# Patient Record
Sex: Female | Born: 1960 | Race: White | Hispanic: No | Marital: Married | Smoking: Never smoker
Health system: Southern US, Academic
[De-identification: ages and names within clinical notes are randomized; demographics above are authoritative.]

## PROBLEM LIST (undated history)

## (undated) DIAGNOSIS — I341 Nonrheumatic mitral (valve) prolapse: Secondary | ICD-10-CM

## (undated) DIAGNOSIS — G43909 Migraine, unspecified, not intractable, without status migrainosus: Secondary | ICD-10-CM

## (undated) DIAGNOSIS — E785 Hyperlipidemia, unspecified: Secondary | ICD-10-CM

## (undated) DIAGNOSIS — K56699 Other intestinal obstruction unspecified as to partial versus complete obstruction: Secondary | ICD-10-CM

## (undated) DIAGNOSIS — R011 Cardiac murmur, unspecified: Secondary | ICD-10-CM

## (undated) DIAGNOSIS — R0602 Shortness of breath: Secondary | ICD-10-CM

## (undated) DIAGNOSIS — J9601 Acute respiratory failure with hypoxia: Secondary | ICD-10-CM

## (undated) DIAGNOSIS — F3289 Other specified depressive episodes: Secondary | ICD-10-CM

## (undated) DIAGNOSIS — K219 Gastro-esophageal reflux disease without esophagitis: Secondary | ICD-10-CM

## (undated) DIAGNOSIS — I10 Essential (primary) hypertension: Secondary | ICD-10-CM

## (undated) HISTORY — DX: Shortness of breath: R06.02

## (undated) HISTORY — PX: SHOULDER SURGERY: SHX246

## (undated) HISTORY — PX: HX BREAST BIOPSY: SHX20

## (undated) HISTORY — DX: Acute respiratory failure with hypoxia: J96.01

## (undated) HISTORY — DX: Hyperlipidemia, unspecified: E78.5

## (undated) HISTORY — PX: HX TUBAL LIGATION: SHX77

## (undated) HISTORY — DX: Cardiac murmur, unspecified: R01.1

## (undated) HISTORY — DX: Gastro-esophageal reflux disease without esophagitis: K21.9

## (undated) HISTORY — PX: INCONTINENCE SURGERY: SHX676

## (undated) HISTORY — PX: BREAST MASS EXCISION: SHX1267

---

## 1993-10-11 ENCOUNTER — Other Ambulatory Visit (HOSPITAL_COMMUNITY): Payer: Self-pay

## 2006-03-31 ENCOUNTER — Emergency Department (HOSPITAL_COMMUNITY): Payer: Self-pay | Admitting: EXTERNAL

## 2012-04-15 ENCOUNTER — Ambulatory Visit: Payer: Self-pay | Attending: ORTHOPEDIC, SPORTS MEDICINE | Admitting: ORTHOPEDIC, SPORTS MEDICINE

## 2012-04-15 ENCOUNTER — Other Ambulatory Visit (INDEPENDENT_AMBULATORY_CARE_PROVIDER_SITE_OTHER): Payer: Self-pay | Admitting: ORTHOPEDIC, SPORTS MEDICINE

## 2012-04-15 ENCOUNTER — Encounter (INDEPENDENT_AMBULATORY_CARE_PROVIDER_SITE_OTHER): Payer: Self-pay | Admitting: ORTHOPEDIC, SPORTS MEDICINE

## 2012-04-15 ENCOUNTER — Ambulatory Visit (HOSPITAL_BASED_OUTPATIENT_CLINIC_OR_DEPARTMENT_OTHER): Payer: Self-pay

## 2012-04-15 VITALS — BP 133/84 | HR 66 | Temp 99.3°F | Ht 63.54 in | Wt 197.0 lb

## 2012-04-15 DIAGNOSIS — M7500 Adhesive capsulitis of unspecified shoulder: Secondary | ICD-10-CM | POA: Insufficient documentation

## 2012-04-15 NOTE — H&P (Addendum)
 [facility]                              DEPARTMENT OF [place], [place] [address]                                PATIENT NAME: Yesenia Walker, Yesenia Walker [facility] WLFAZM:[id number]  DATE OF SERVICE:04/15/2012  DATE OF BIRTH: 1961/09/19    HISTORY AND PHYSICAL    SUBJECTIVE:  Yesenia Walker is a 51 year old right-hand dominant female who presented to clinic today for evaluation of her left shoulder pain.  She apparently had a work-related injury on February 15, 2010, when she was lifting a mop bucket into a sink and felt a pop and pain in her shoulder.  She apparently had 3 MRIs before finding out she had a labral tear, so she subsequently underwent an arthroscopy with a labral debridement and debridement of partial-thickness rotator cuff tear as well as a subacromial decompression in November 2011.  Following that surgery, she was sent to physical therapy.  She also had some physical therapy beforehand but she continues to be symptomatic.  She reports it really did not help.  She has continued to complain of pain around the periscapular region and into the trapezius area.  She has had a couple trigger point injections which helped for a while.  She also notices some pain over into the chest.  She also has some pain around the anterior part of her shoulder.  She reports it radiates into the biceps.  She denies any numbness or tingling distally and no pain radiating down the arm.    PAST MEDICAL HISTORY:  Hypertension, bladder dysfunction.    PAST SURGICAL HISTORY:  Cholecystectomy, tubal ligation and shoulder as mentioned above.    CURRENT MEDICATIONS:  1.  Propranolol .  2.  Hydrochlorothiazide .    ALLERGIES:  Ultram.    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  Denies use of tobacco or alcohol.  She is married.    FAMILY HISTORY:  Her mother has history of diabetes and hypertension.  Grandmother and aunt history of  cancer.    REVIEW OF SYSTEMS:  She has noticed some night sweats due to menopause.  She has some urinary issues.  She has mitral valve prolapse and experiences chest pain from that.  She has left leg swelling.  She is being treated for a bladder disease, so she has been having problems with urgency.  She has problems with depression.  All other systems are essentially negative except for the HPI.    OBJECTIVE:  On physical exam today, Yesenia Walker is a pleasant female in no acute distress, appropriate mood and affect.  Blood pressure 133/84, pulse 66, temperature 99.3 degrees Fahrenheit.  She is 197 pounds and 161.4 cm.  Her sclerae are nonicteric.  Breathing nonlabored.  Abdomen:   Not distended.  On evaluation of left upper extremity, she had forward elevation passively to about 140, abduction to about 85, external rotation to 40 versus 80 on the contralateral side.  Internal rotation to L5 with pain associated with that.  She was tender to palpation on the  anterior aspect of her shoulder, no AC joint tenderness.  She was tender to palpation all around the periscapular region.  Her cuff strength was 4/5 in external rotation, 5/5 internal rotation.  She was neurovascularly intact distally.  No skin rashes over the upper extremity.    IMAGING:  Radiographs were reviewed in the PACS system of the left shoulder, which are negative.  We reviewed an MRI report from August 24, 2010, which stated there was an anterior-superior labral tear with no evidence of rotator cuff tear identified.  There was fluid in the subacromial bursa, very minimal hypertrophy of the [facility] joint noted to be present.  We also reviewed the operative note from Dr. Joesph dated November 02, 2010, which stated he did a debridement of the superior labral tear as well as a partial-thickness rotator cuff tear and subacromial decompression, but it was very minimal debridement of the labral tear.    ASSESSMENT:  Left shoulder postoperative adhesive  capsulitis.      PLAN:  We had a discussion with Yesenia Walker concerning treatment options for her shoulder.  At this point, we would offer her a left shoulder arthroscopy with lysis of adhesions, manipulation under anesthesia with the understanding this may not necessarily help with the pain that she has around the periscapular region and the trapezial region.  If she should decide to have surgery, we would offer her that.  She will need physical therapy 3-4 times a week postoperatively as well as pain medication for postoperative pain control.  We will not make a return appointment for now.      Greig Frost RIGGERS  [place] [facility]    Zachary Ceo, MD  Assistant Professor  [facility] Department of Orthopaedics    [id number]; D: 04/15/2012 12:04:43; T: 04/15/2012 12:34:28    cc: Yesenia Walker       [address]       [place], [place] [address]     I have seen and examined this patient with the Physician Assistant and concur with assessment and plan.    Zachary MARLA Ceo, MD

## 2014-01-30 DIAGNOSIS — Z8249 Family history of ischemic heart disease and other diseases of the circulatory system: Secondary | ICD-10-CM | POA: Insufficient documentation

## 2014-01-30 DIAGNOSIS — Z842 Family history of other diseases of the genitourinary system: Secondary | ICD-10-CM | POA: Insufficient documentation

## 2014-01-30 DIAGNOSIS — Z833 Family history of diabetes mellitus: Secondary | ICD-10-CM | POA: Insufficient documentation

## 2014-04-09 DIAGNOSIS — R4589 Other symptoms and signs involving emotional state: Secondary | ICD-10-CM | POA: Insufficient documentation

## 2014-04-09 DIAGNOSIS — F419 Anxiety disorder, unspecified: Secondary | ICD-10-CM | POA: Insufficient documentation

## 2014-04-09 DIAGNOSIS — M19019 Primary osteoarthritis, unspecified shoulder: Secondary | ICD-10-CM | POA: Insufficient documentation

## 2014-04-09 DIAGNOSIS — G8929 Other chronic pain: Secondary | ICD-10-CM | POA: Insufficient documentation

## 2014-04-09 DIAGNOSIS — Z5689 Other problems related to employment: Secondary | ICD-10-CM | POA: Insufficient documentation

## 2014-04-09 DIAGNOSIS — M67919 Unspecified disorder of synovium and tendon, unspecified shoulder: Secondary | ICD-10-CM | POA: Insufficient documentation

## 2014-11-12 DIAGNOSIS — Z6834 Body mass index (BMI) 34.0-34.9, adult: Secondary | ICD-10-CM | POA: Insufficient documentation

## 2017-11-14 DIAGNOSIS — N3010 Interstitial cystitis (chronic) without hematuria: Secondary | ICD-10-CM | POA: Insufficient documentation

## 2017-11-14 DIAGNOSIS — R109 Unspecified abdominal pain: Secondary | ICD-10-CM | POA: Insufficient documentation

## 2017-11-15 DIAGNOSIS — N200 Calculus of kidney: Secondary | ICD-10-CM | POA: Insufficient documentation

## 2017-11-15 DIAGNOSIS — N201 Calculus of ureter: Secondary | ICD-10-CM | POA: Insufficient documentation

## 2018-07-10 ENCOUNTER — Ambulatory Visit (HOSPITAL_COMMUNITY)
Admission: RE | Admit: 2018-07-10 | Discharge: 2018-07-10 | Disposition: A | Discharge status: Home / Self Care | Payer: Self-pay | Source: Ambulatory Visit

## 2019-10-24 ENCOUNTER — Ambulatory Visit (HOSPITAL_COMMUNITY)
Admission: RE | Admit: 2019-10-24 | Discharge: 2019-10-24 | Disposition: A | Discharge status: Home / Self Care | Payer: Self-pay | Source: Ambulatory Visit

## 2019-10-24 IMAGING — MG 3D SCREENING MAMMO BIL W/CAD
5 series · 7 of 24 positions shown · non-contrast
Comparison: 09/25/2018 and 09/24/2017.

------------- REPORT [id number] -------------
[facility]
[address]
Daina Ms.ANUGERAH, [place] NURUL:
We wish to report the following on your recent mammography examination. We are sending a report to your referring physician or other health care provider. 
(       Normal/Negative:
No evidence of cancer.
This statement is mandated by the [place], Department of Health.
Your examination was performed by one of our technologists, who are registered radiological technologists and also specially certified in mammography:
___
Parlak, Edaly (M)
Nepomuceno, Martinez (M)

Your mammogram was interpreted by our radiologist.
( 
Sofeine Made, M.D.
(Annual Breast Examination by a physician or other health care provider
(Annual Mammography Screening beginning at age 40
(Monthly Breast Self Examination
------------- REPORT [id number] -------------
EXAM:  3D BILATERAL ANNUAL SCREENING DIGITAL MAMMOGRAM WITH TOMOSYNTHESIS AND CAD
INDICATION: Screening.

[R CC · right · 0.10mm/px · 2 of 2 slices shown]
[im 1/2]
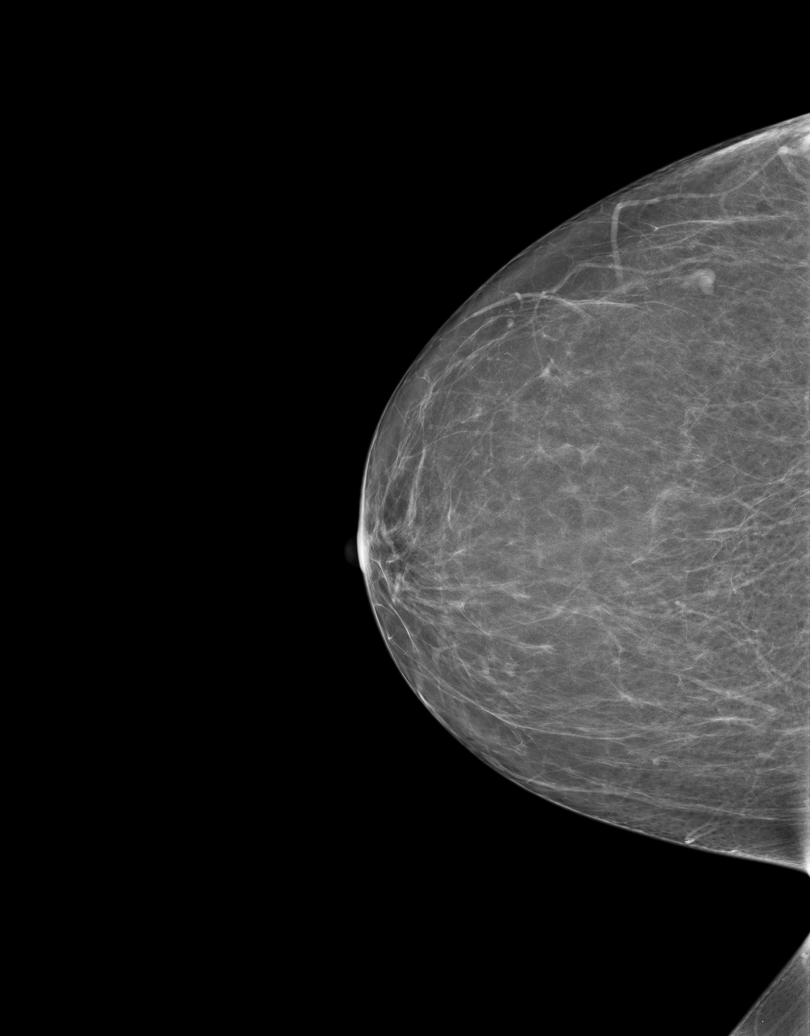
[im 2/2]
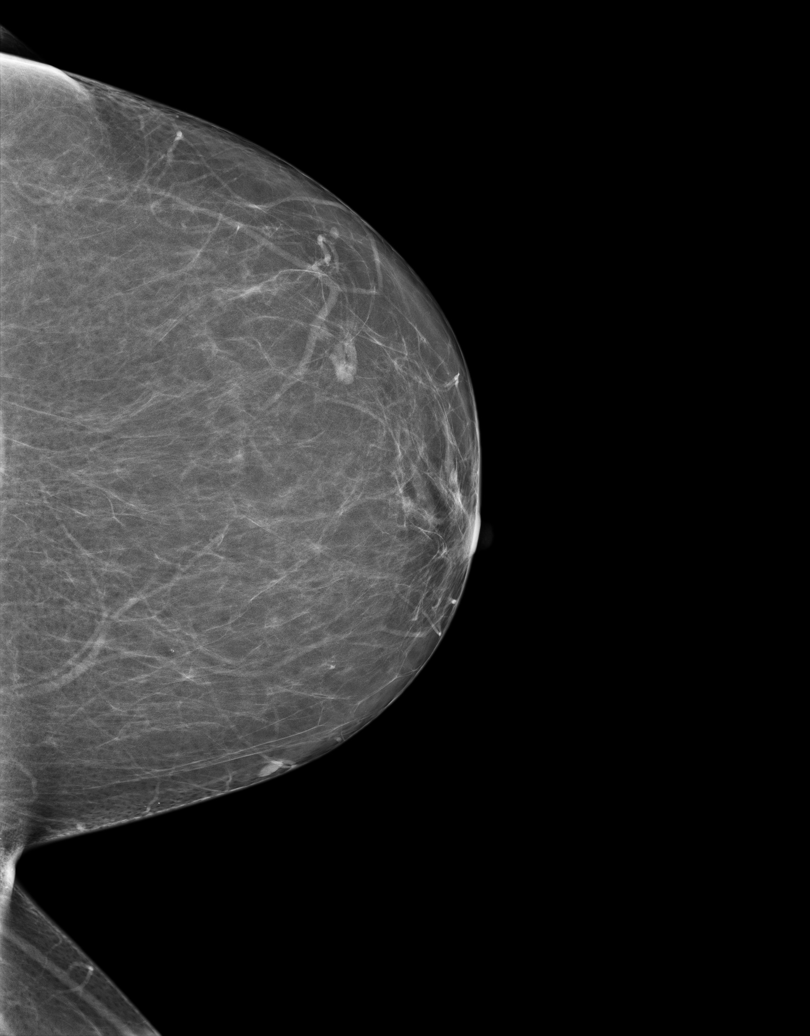

[3D SCREENING MAMMO BIL W/CAD · 2 acquisitions, 2 frames shown (1 of 2)]
[im 1/2]
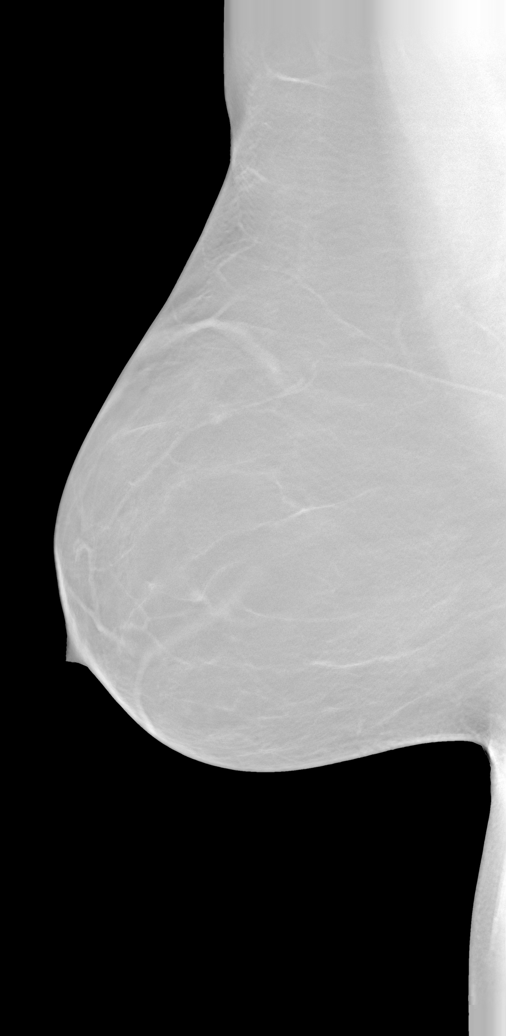
[im 2/2]
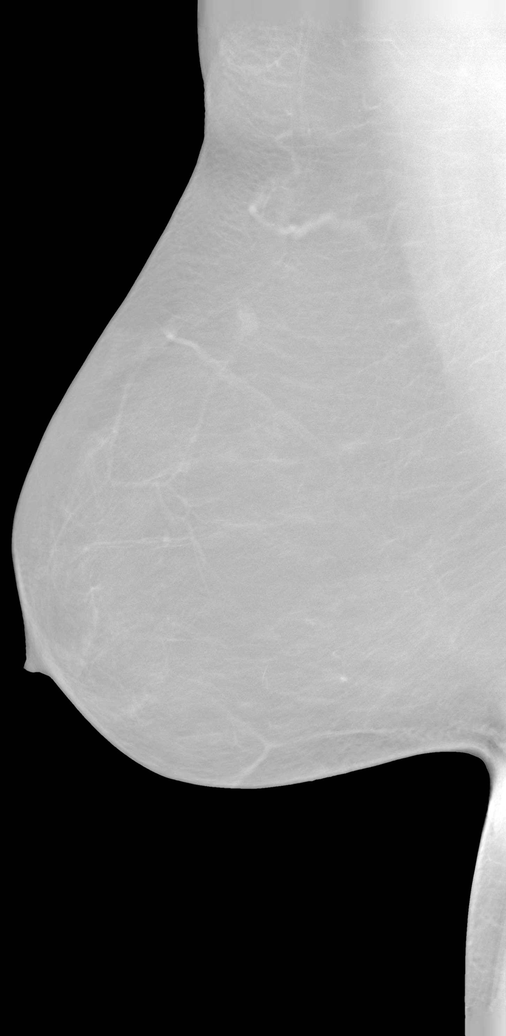

[R]
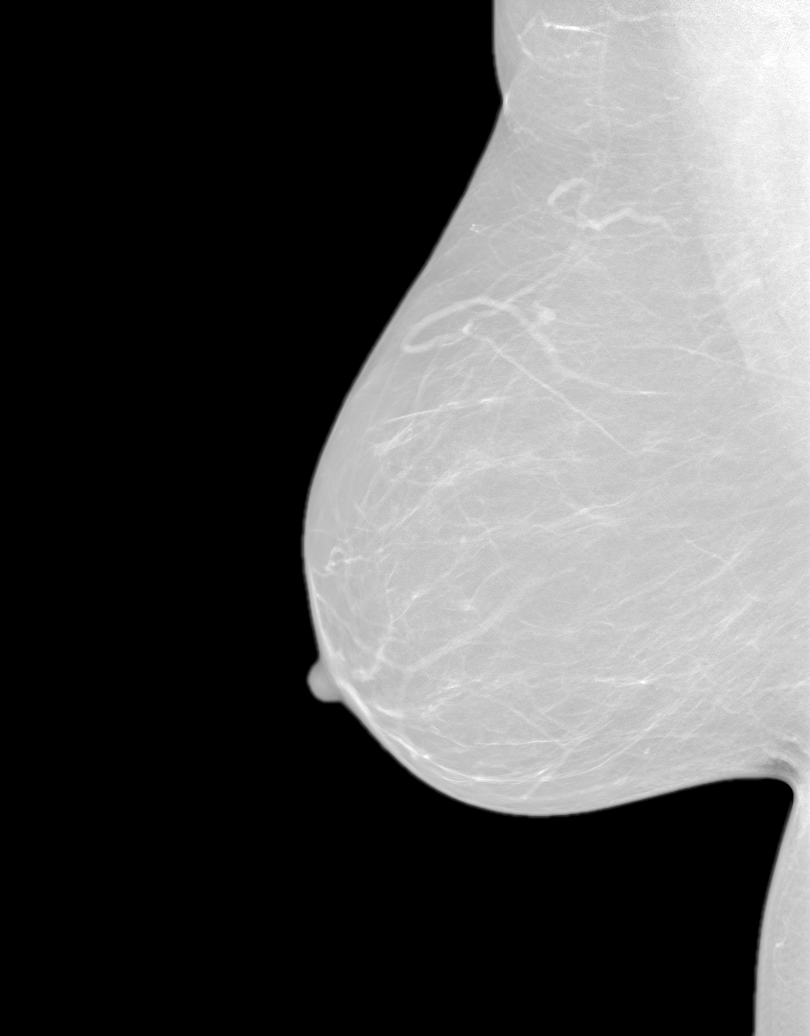

[3D SCREENING MAMMO BIL W/CAD (2 of 2) · tomo slice 14/90.0]
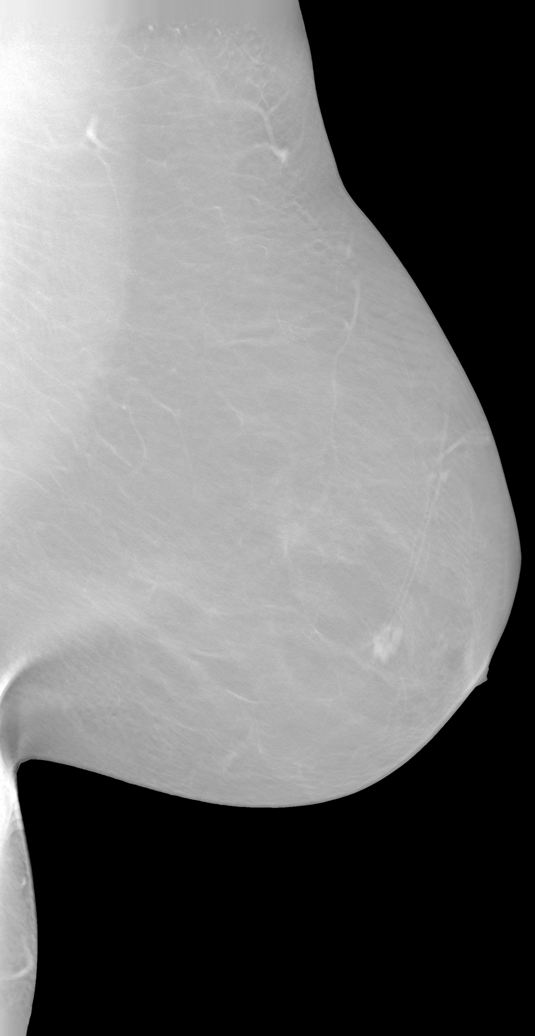

[L]
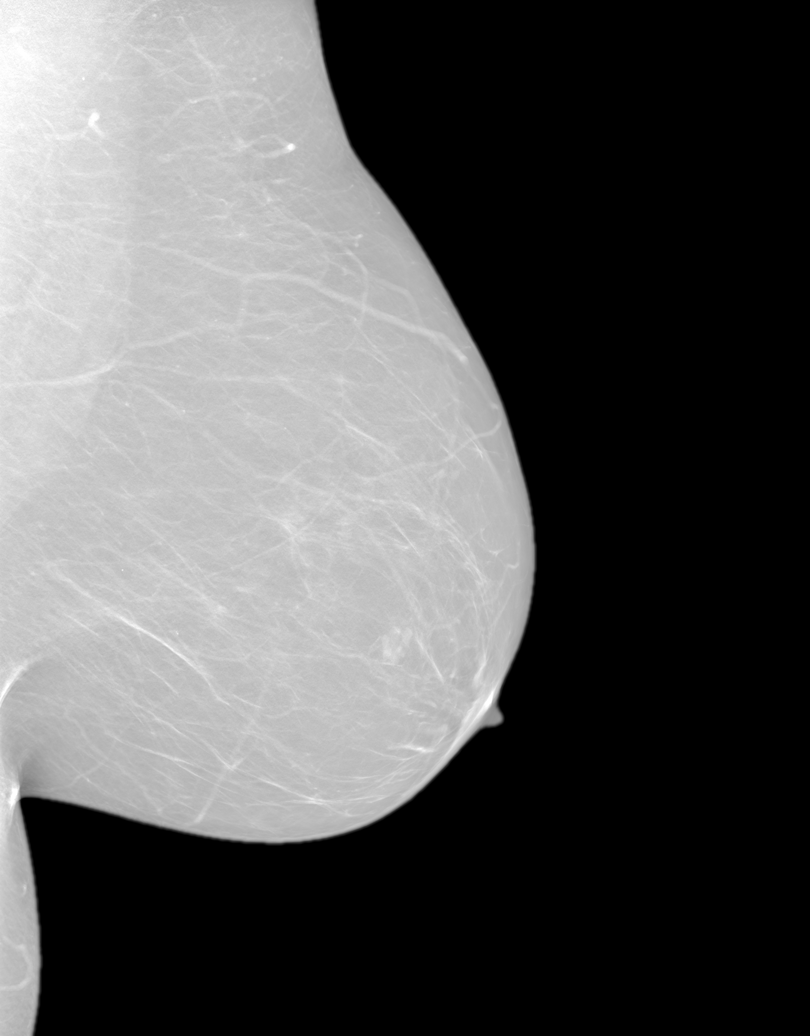

[7 of 24 positions shown; findings below may reference images not displayed]

FINDINGS: There are scattered fibroglandular elements.  There is no mass or suspicious cluster of microcalcifications.   There is no architectural distortion, skin thickening or nipple retraction.
IMPRESSION: 1.  BIRADS 2-Benign findings. Patient has been added in a reminder system with a target date for the next screening mammography.

2.  DENSITY CODE – B (Scattered areas of fibroglandular density). 

Final Assessment Code:

Bi-Rads 2 

BI-RADS 0
Need additional imaging evaluation

BI-RADS 1
Negative mammogram

BI-RADS 2
Benign finding

BI-RADS 3
Probably benign finding; short-interval follow-up suggested

BI-RADS 4
Suspicious abnormality; biopsy should be considered

BI-RADS 5
Highly suggestive of malignancy; appropriate action should be taken

BI-RADS 6
Known biopsy-proven malignancy; appropriate action should be taken

NOTE:
In compliance with Federal regulations, the results of this mammogram are being sent to the patient.

## 2020-10-22 IMAGING — US ABD COMPLETE
1 series · 14 of 25 positions shown · non-contrast
Comparison: None available.

EXAM:  COOKE PROFESSIONAL READ ABD U/S COMPLETE
INDICATION: R10.31.

[Series 1: abd complete · 14 of 75 slices shown]
[im 1/75]
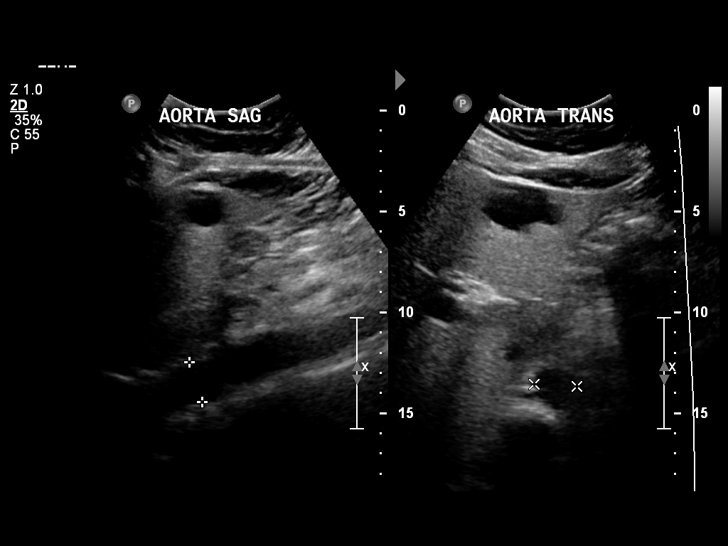
[im 7/75]
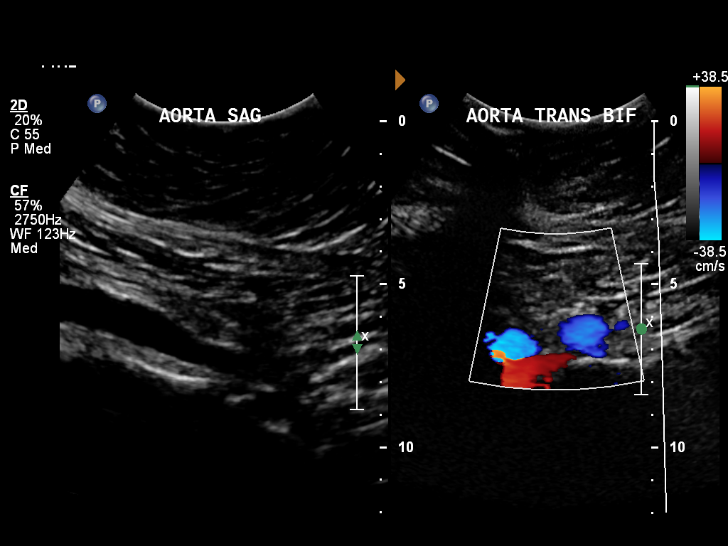
[im 13/75]
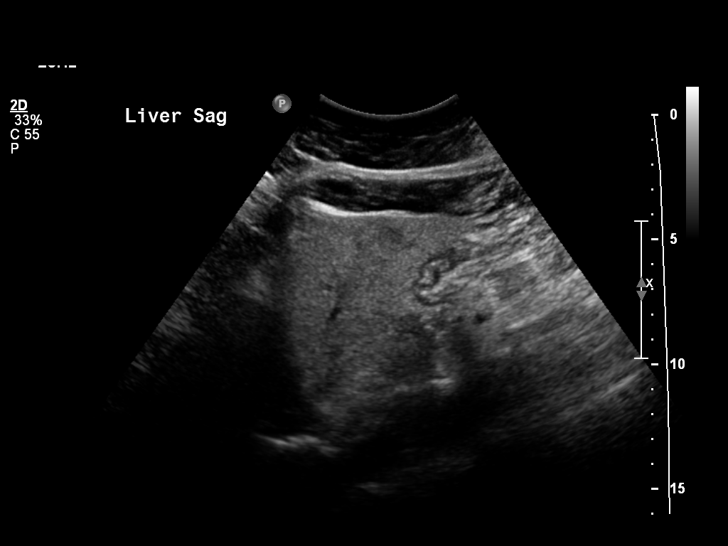
[im 19/75]
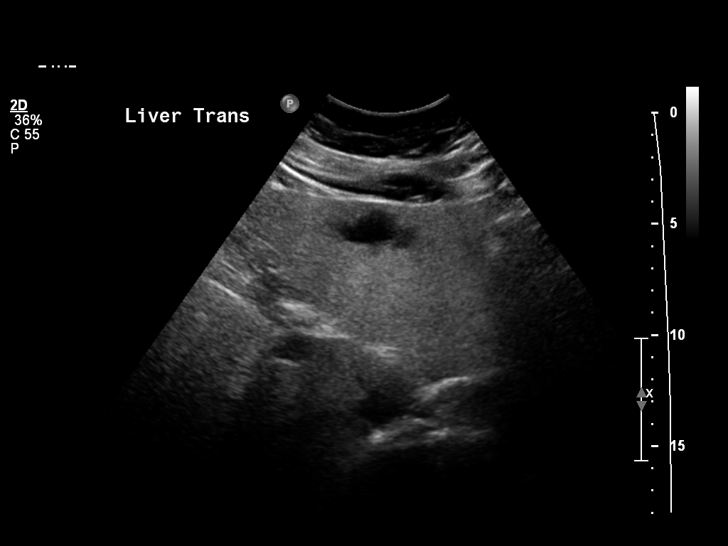
[im 25/75]
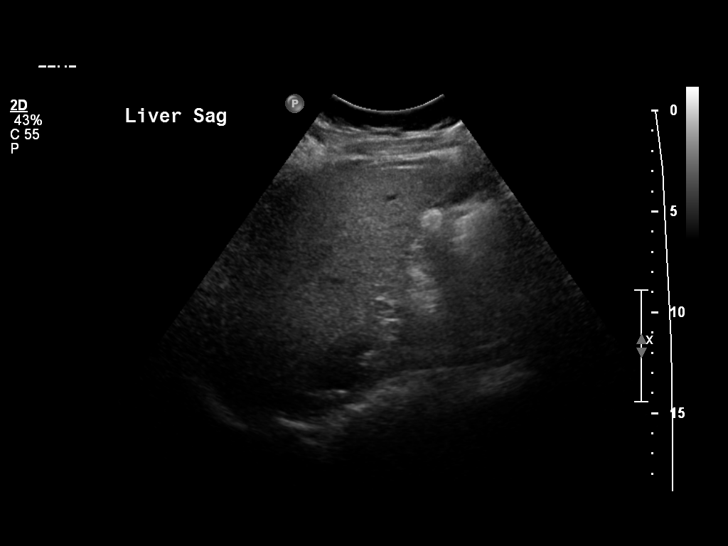
[im 28/75]
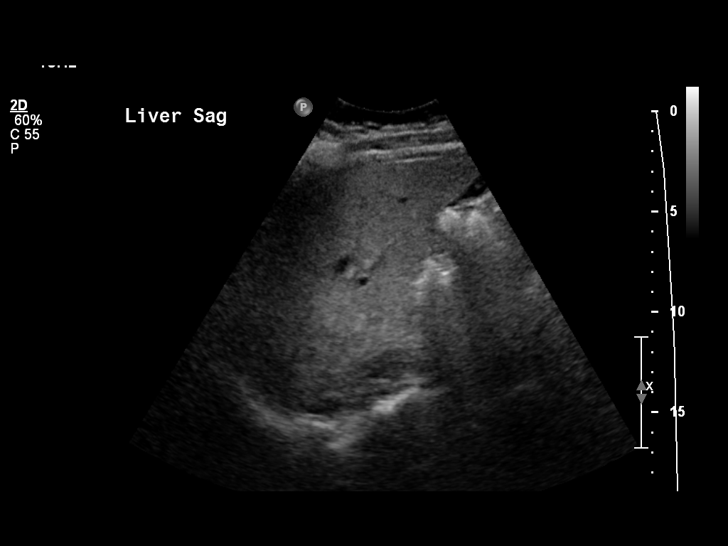
[im 34/75]
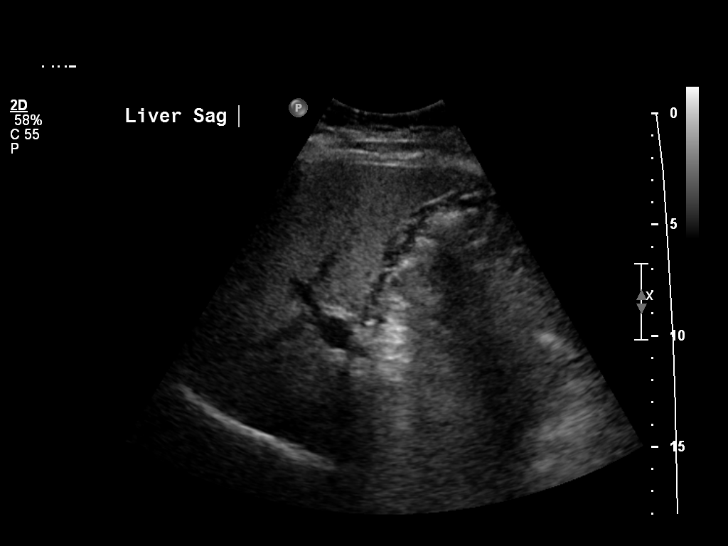
[im 41/75]
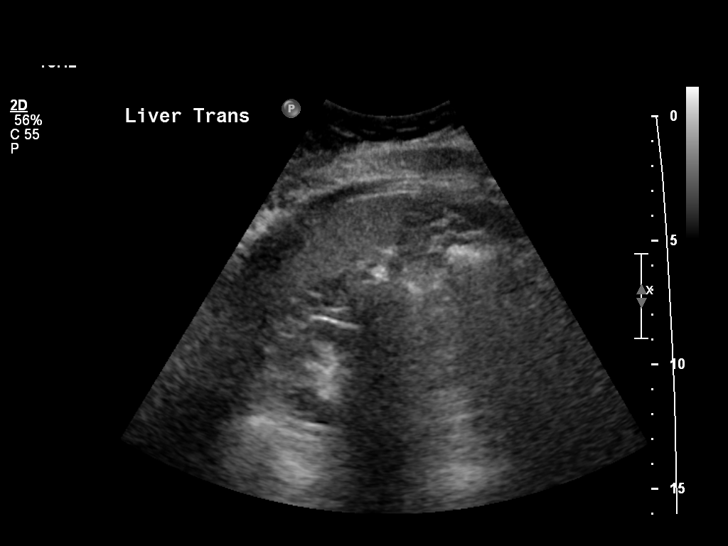
[im 47/75]
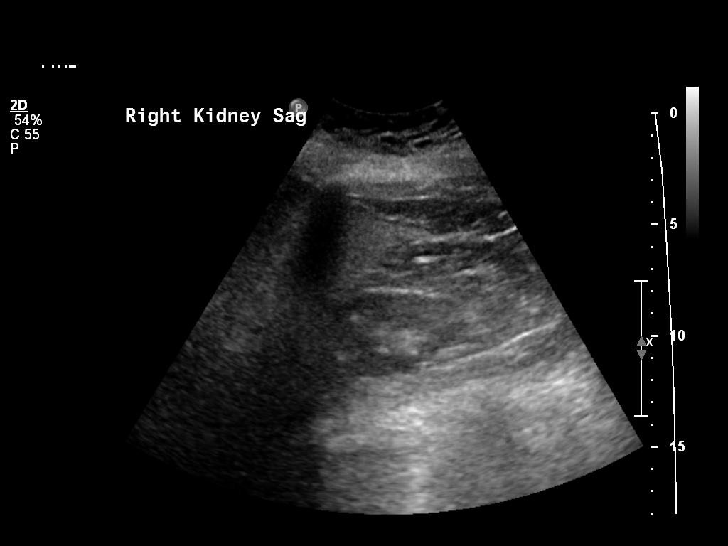
[im 50/75]
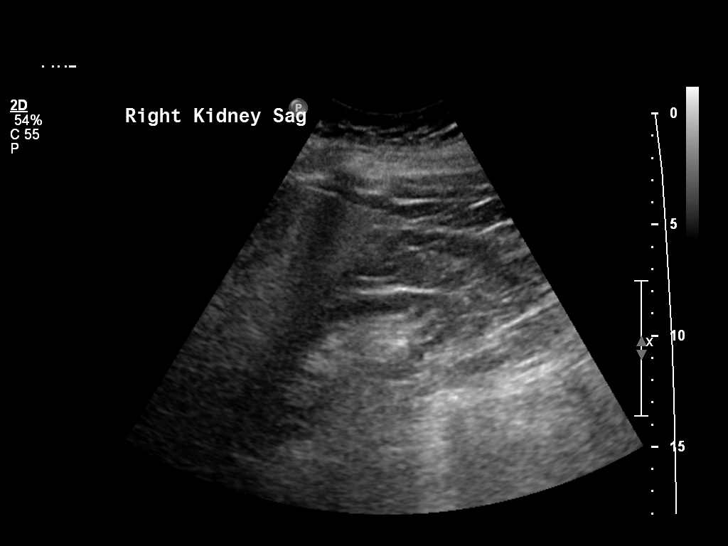
[im 56/75]
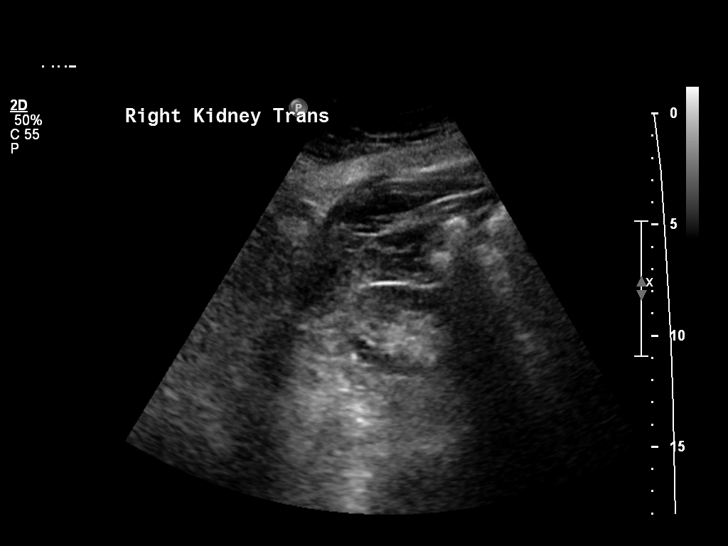
[im 62/75]
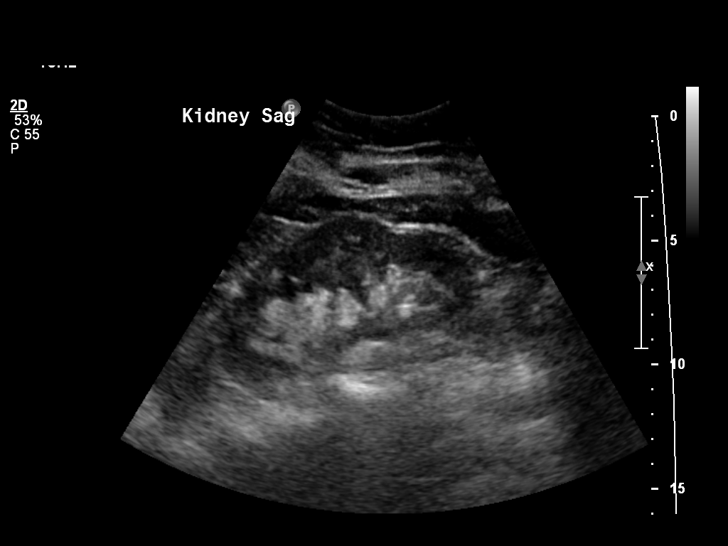
[im 68/75]
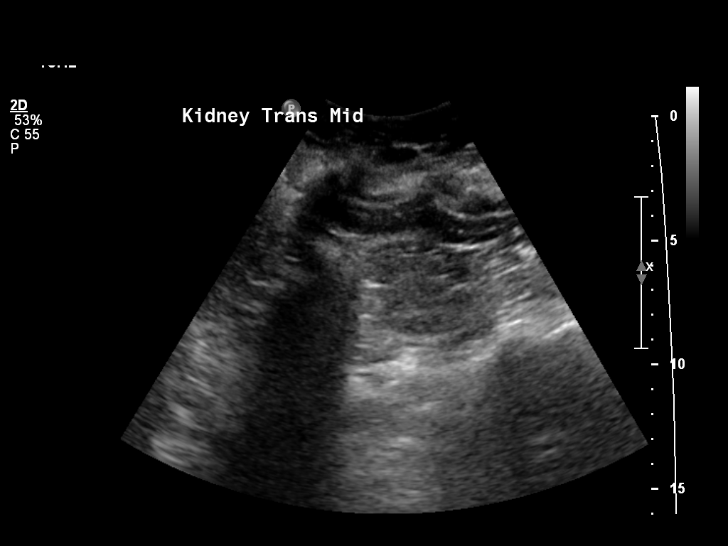
[im 75/75]
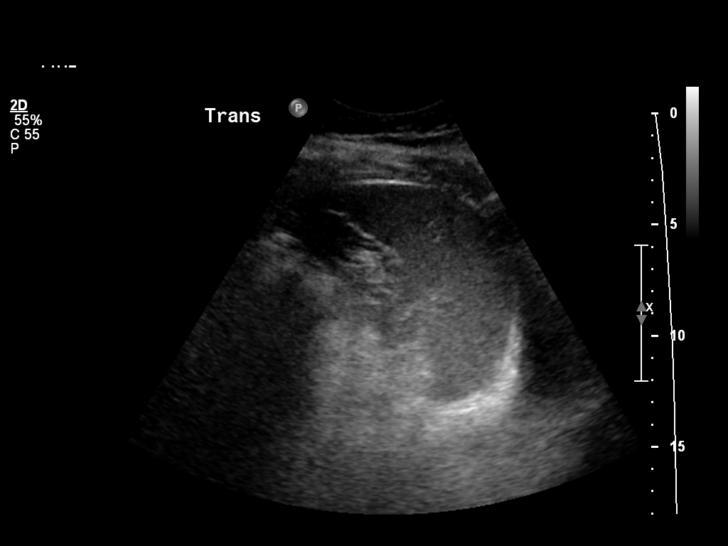

[14 of 25 positions shown; findings below may reference images not displayed]

FINDINGS: Liver is echogenic compatible with fatty infiltration. Fatty infiltration limits evaluation for focal hepatic mass. There is a 3 cm hepatic cyst. There is no intra or extrahepatic biliary ductal dilatation. Common bile duct measures 4.5 mm. Gallbladder is surgically absent. Pancreas is incompletely visualized due to artifact from overlying bowel gas. Spleen measures 11 cm and is unremarkable.

Kidneys are normal in echogenicity and measure 10.5 cm bilaterally. There is no hydronephrosis, mass or cyst on either side.

Visualized abdominal aorta is without aneurysmal dilatation. IVC is normal. Portal vein measures 9 mm in diameter and demonstrates hepatopetal flow. Hepatic veins are also patent. There is no ascites.
IMPRESSION: 1. Fatty liver. 

2. Prior cholecystectomy. 

3. Pancreas incompletely visualized due to artifact from overlying bowel gas.

## 2021-01-06 ENCOUNTER — Ambulatory Visit (HOSPITAL_COMMUNITY)
Admission: RE | Admit: 2021-01-06 | Discharge: 2021-01-06 | Disposition: A | Discharge status: Home / Self Care | Payer: Self-pay | Source: Ambulatory Visit

## 2021-01-06 IMAGING — MG 3D SCREENING MAMMO BIL W/CAD & TOMO
5 series · 7 of 24 positions shown · non-contrast
Comparison: 05/27/2020

------------- REPORT [id number] -------------
[facility]
[address]
Tiger Ms.SOJO, GIDGETH:
We wish to report the following on your recent mammography examination. We are sending a report to your referring physician or other health care provider. 
(       Normal/Negative:
No evidence of cancer.
This statement is mandated by the [place], Department of Health.
Your examination was performed by one of our technologists, who are registered radiological technologists and also specially certified in mammography:
___
Markland, Marjuan (M)

Your mammogram was interpreted by our radiologist.
( 
Collette Sedman, M.D.
(Annual Breast Examination by a physician or other health care provider
(Annual Mammography Screening beginning at age 40
(Monthly Breast Self Examination
------------- REPORT [id number] -------------
﻿
                         #:
EXAM:  3D BILATERAL ANNUAL SCREENING DIGITAL MAMMOGRAM WITH CAD AND TOMOSYNTHESIS
INDICATION: Screening.

[R]
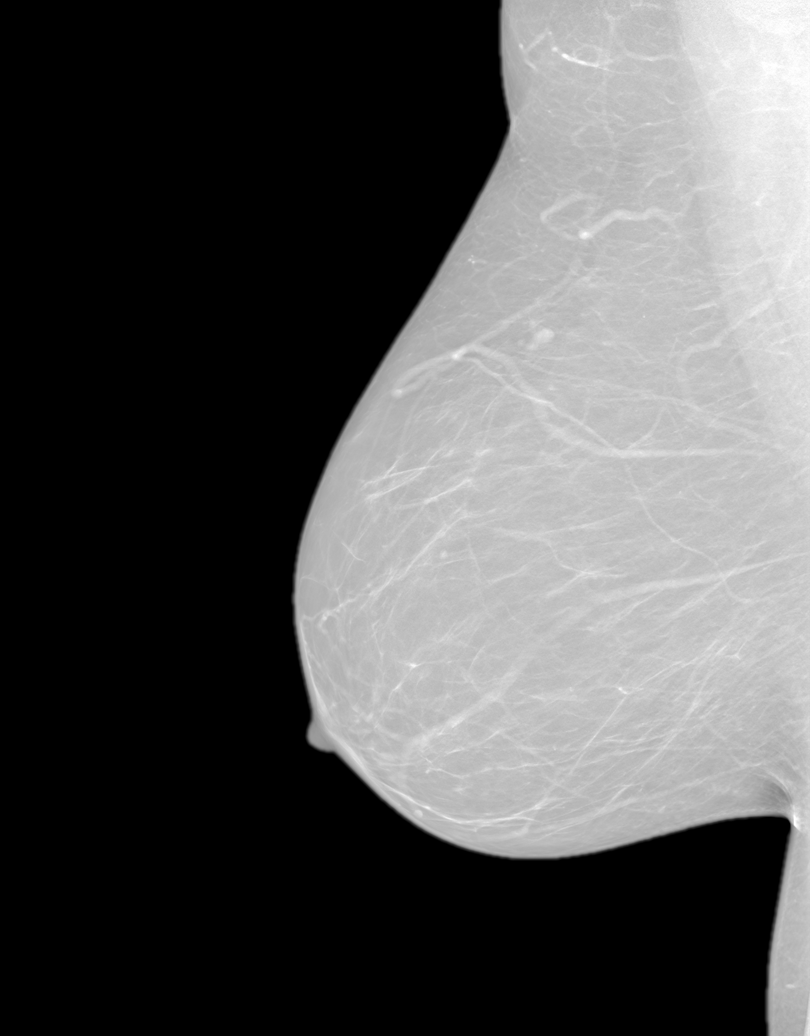

[L]
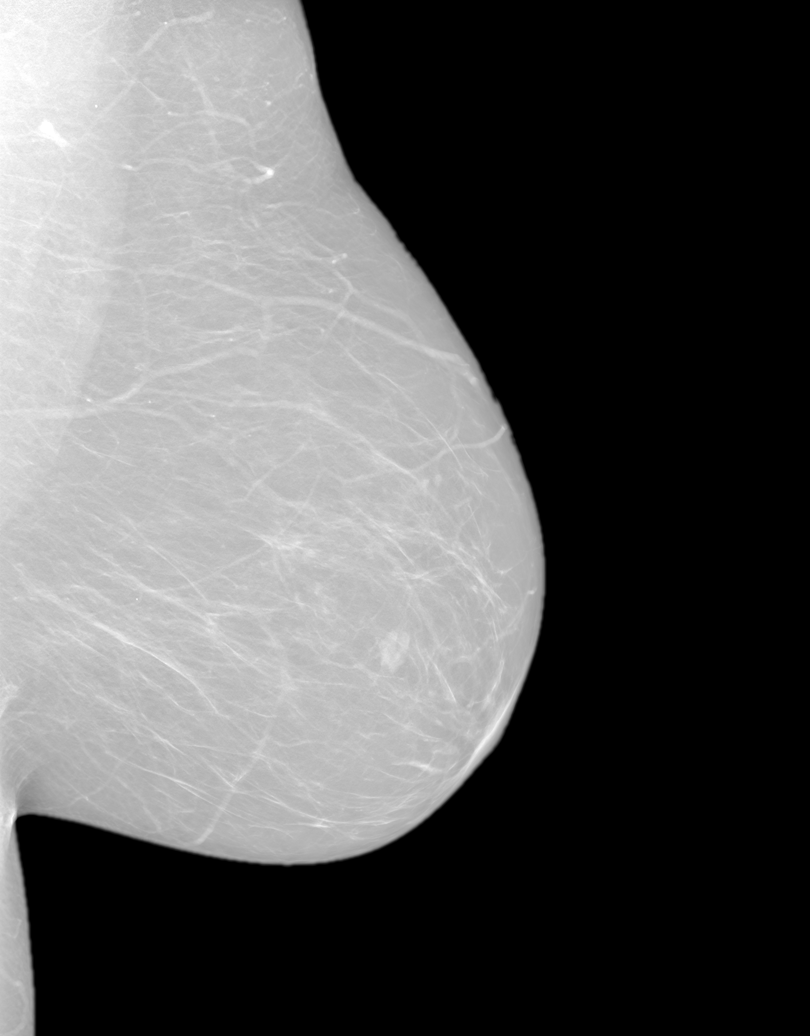

[Series 4582: R CC tomo · right · 2 of 2 slices shown]
[im 1/2]
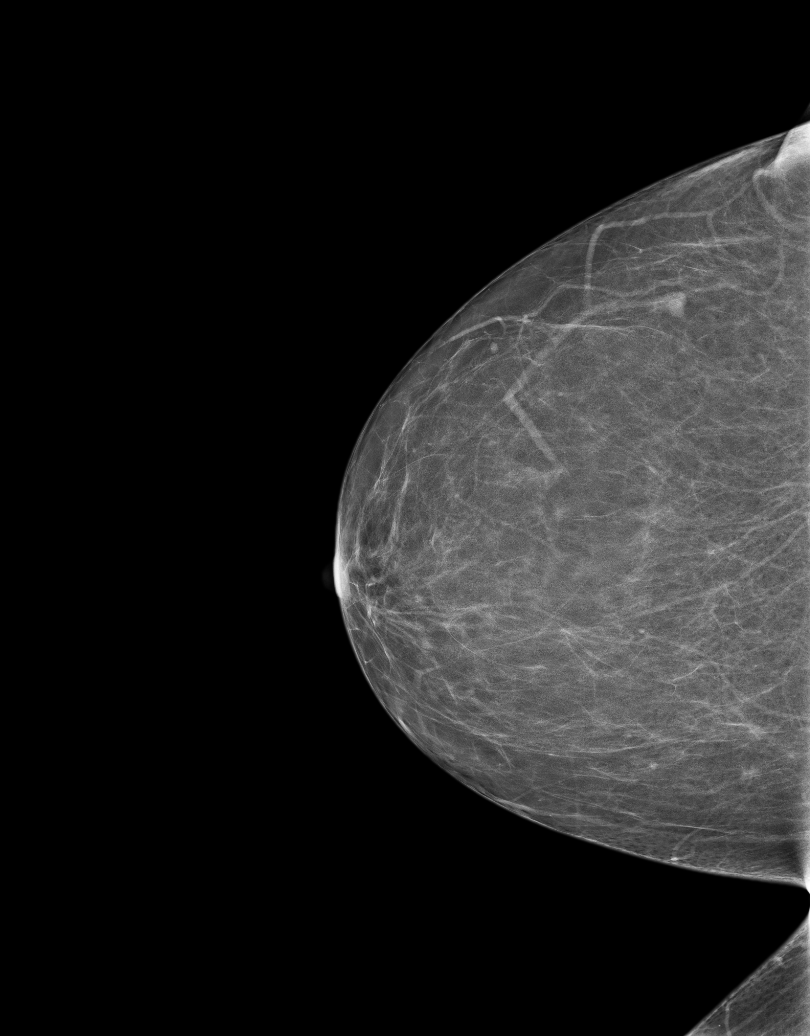
[im 2/2]
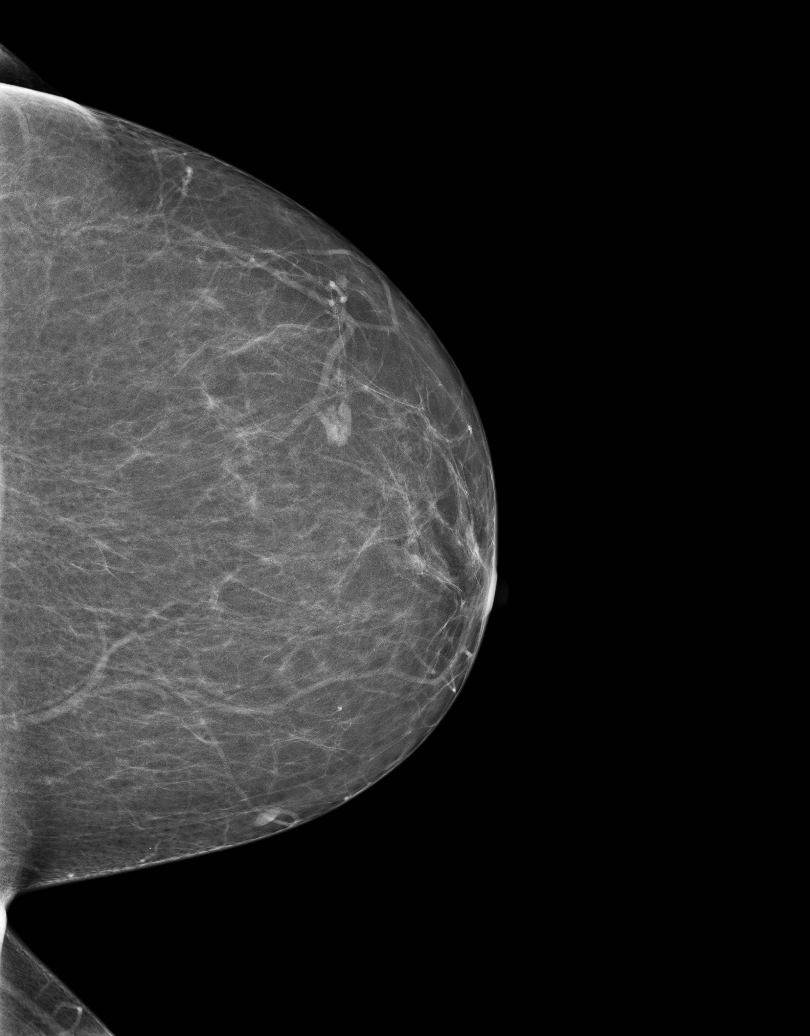

[Series 4584: 3D SCREENING MAMMO BIL W/CAD & TOMO tomo · 2 acquisitions, 2 frames shown (1 of 2)]
[im 1/2]
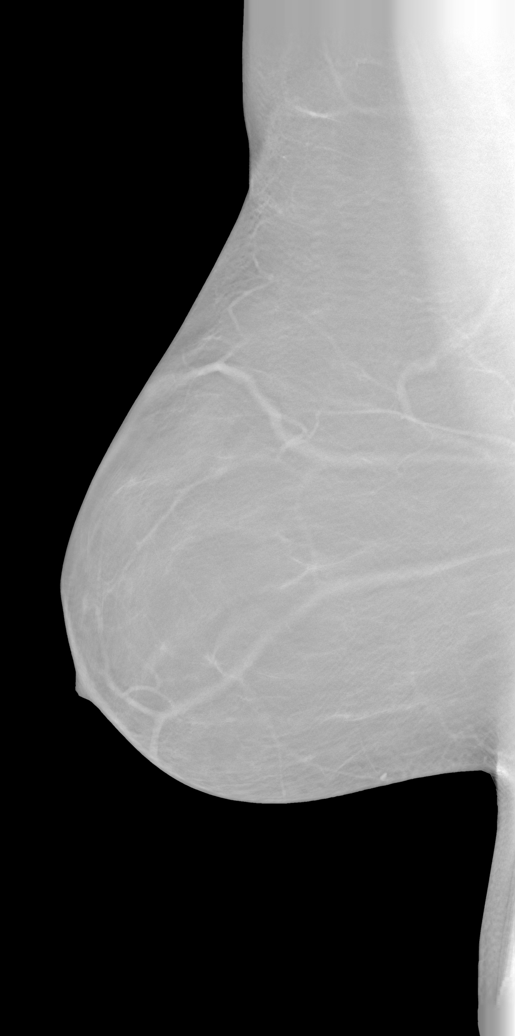
[im 2/2]
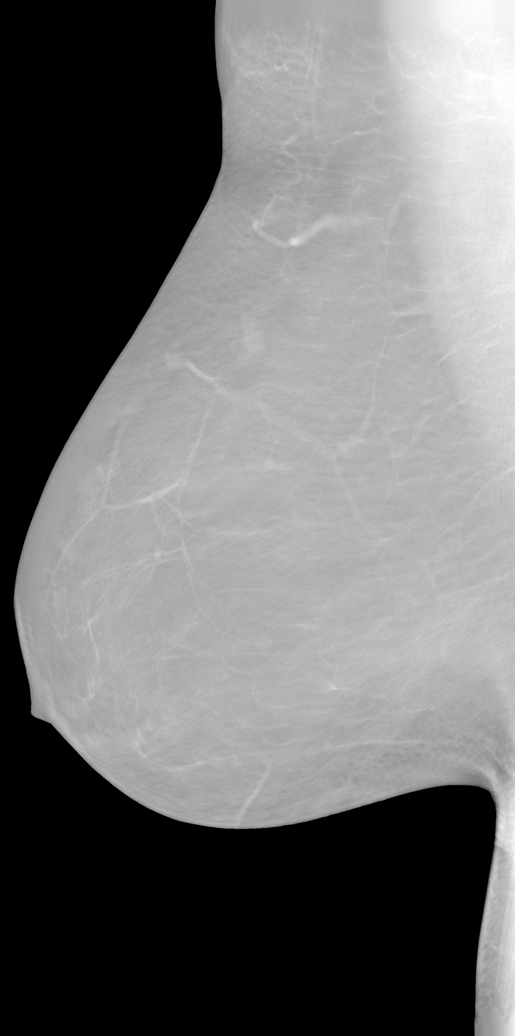

[3D SCREENING MAMMO BIL W/CAD & TOMO tomo (2 of 2) · tomo slice 15/92.0]
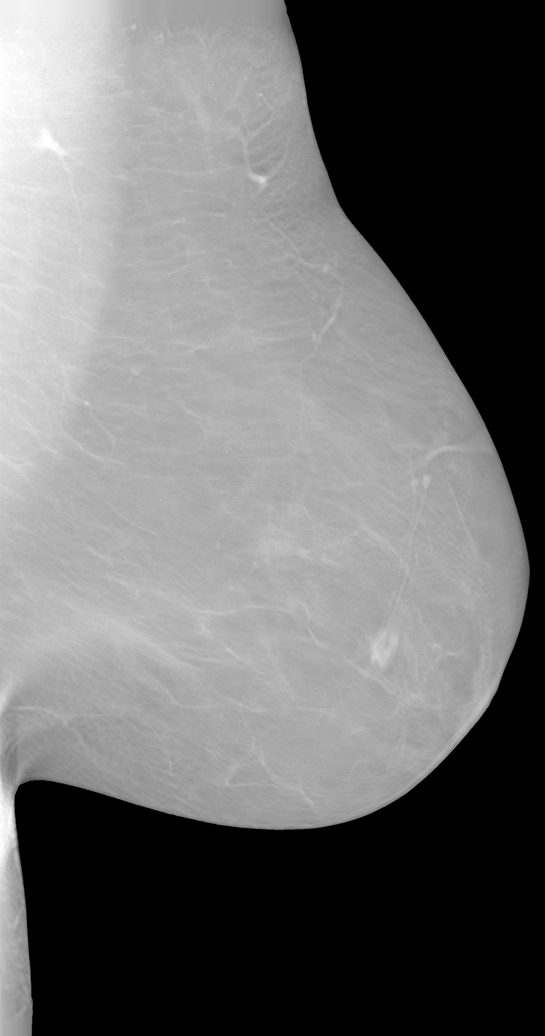

[7 of 24 positions shown; findings below may reference images not displayed]

FINDINGS: There are scattered fibroglandular elements.  There is no mass or suspicious cluster of microcalcifications.   There is no architectural distortion, skin thickening or nipple retraction.
IMPRESSION: 1.  BIRADS 2-Benign findings. Patient has been added in a reminder system with a target date for the next screening mammography.

2.  DENSITY CODE – B (Scattered areas of fibroglandular density).  

Final Assessment Code:

Bi-Rads 2 

BI-RADS 0
Need additional imaging evaluation.

BI-RADS 1
Negative mammogram.

BI-RADS 2
Benign finding.

BI-RADS 3
Probably benign finding; short-interval follow-up suggested.

BI-RADS 4
Suspicious abnormality; biopsy should be considered.

BI-RADS 5
Highly suggestive of malignancy; appropriate action should be taken.

BI-RADS 6
Known biopsy-proven malignancy; appropriate action should be taken.

NOTE:
In compliance with Federal regulations, the results of this mammogram are being sent to the patient.

## 2021-04-20 DIAGNOSIS — N3941 Urge incontinence: Secondary | ICD-10-CM | POA: Insufficient documentation

## 2021-05-28 IMAGING — US ABD LIMITED
1 series · 14 of 25 positions shown · non-contrast
Comparison: 09/25/2019.

EXAM:  JOANA FERNANDO PROFESSIONAL READ ABD U/S LMTD
INDICATION: Fatty liver.

[Series 1: abd limited · 14 of 49 slices shown]
[im 1/49]
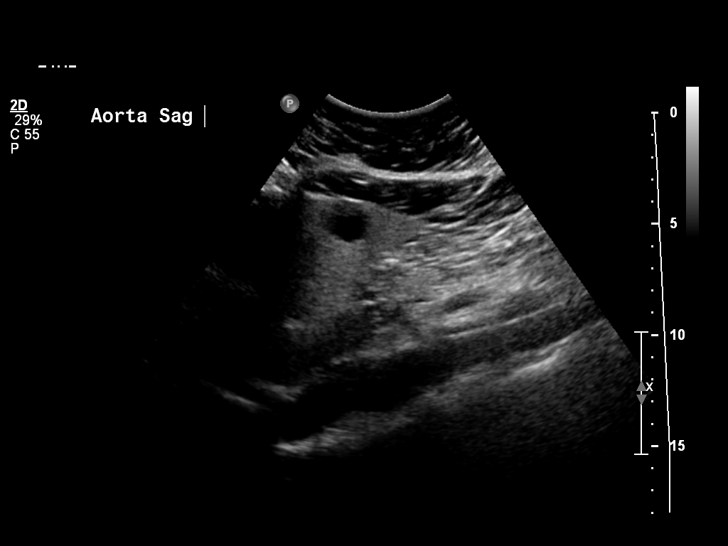
[im 5/49]
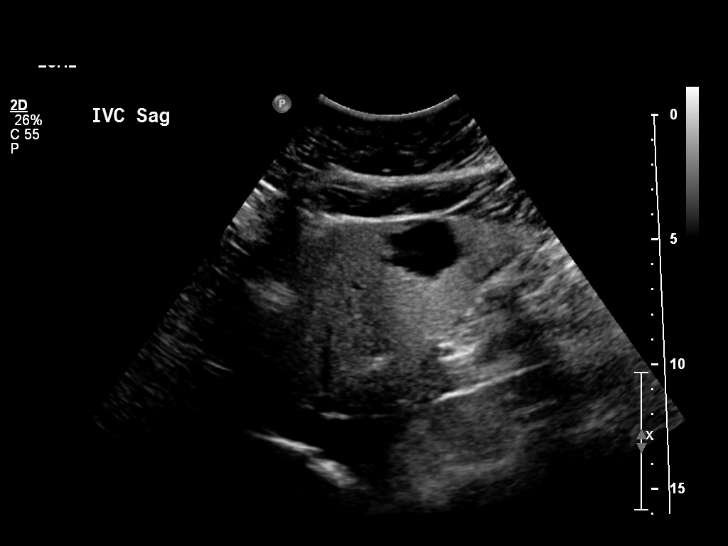
[im 9/49]
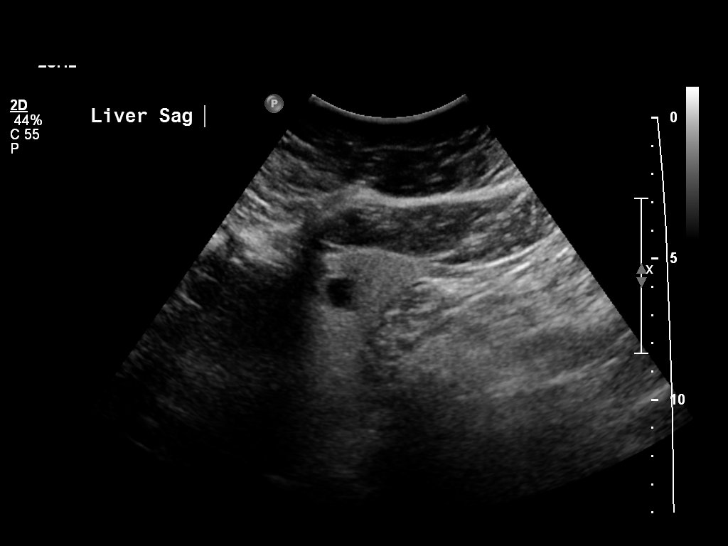
[im 13/49]
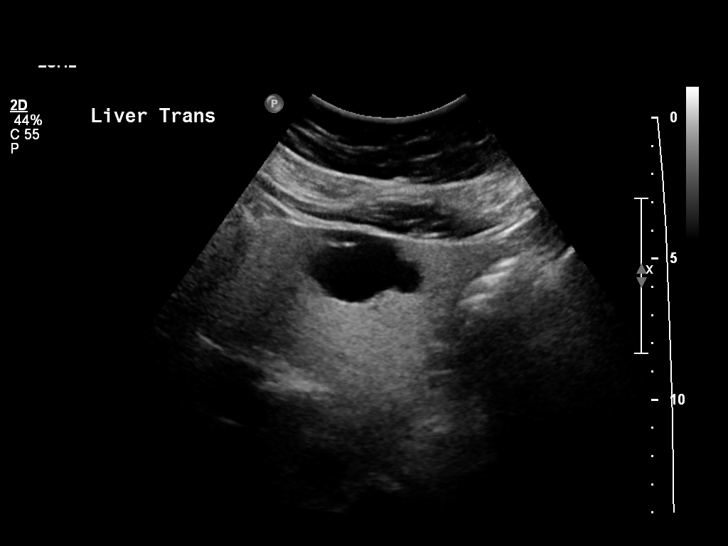
[im 17/49]
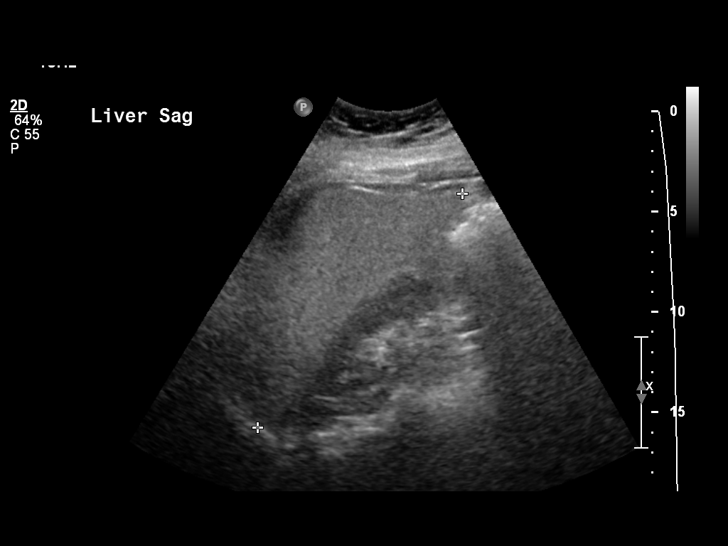
[im 19/49]
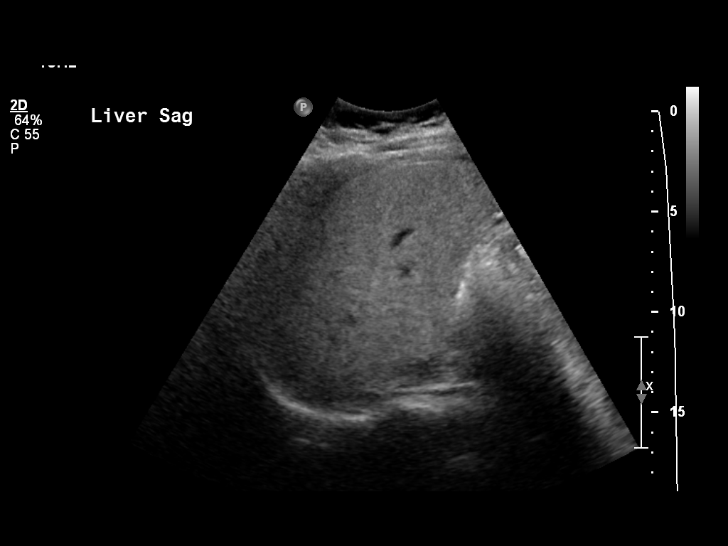
[im 23/49]
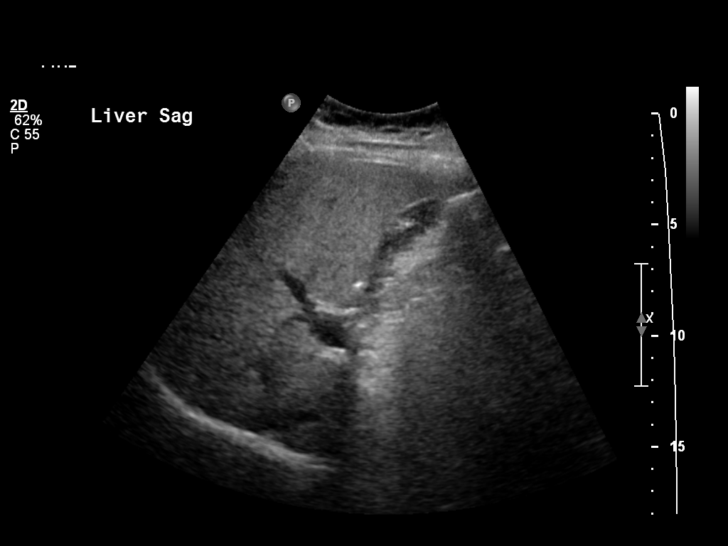
[im 27/49]
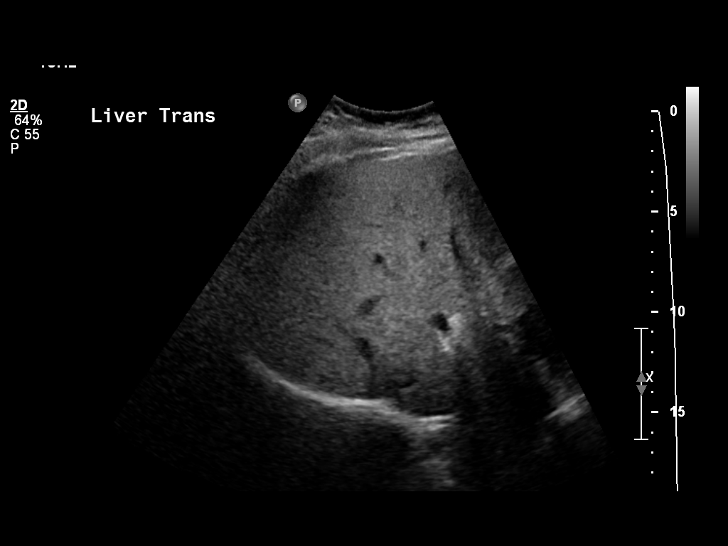
[im 31/49]
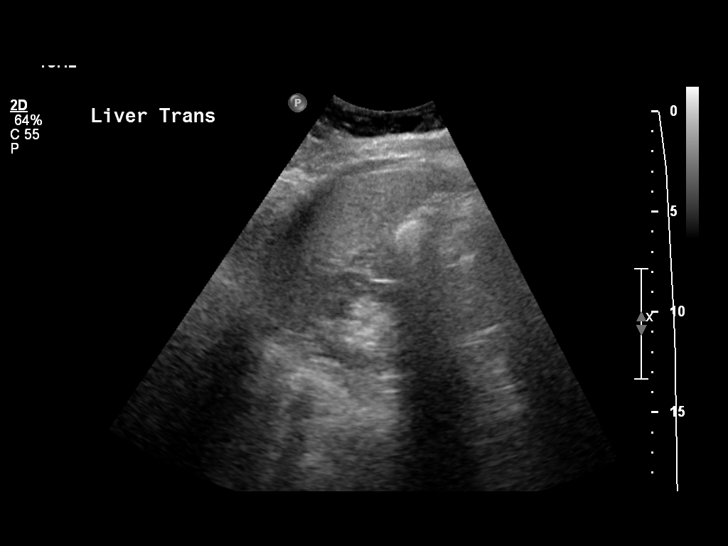
[im 33/49]
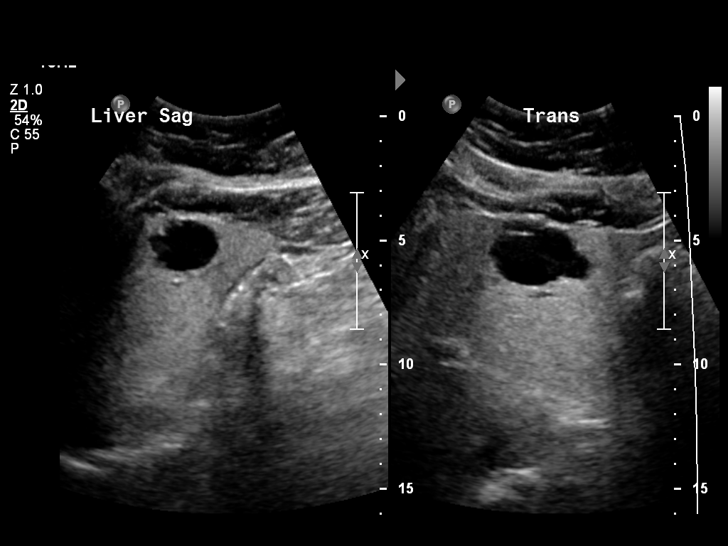
[im 37/49]
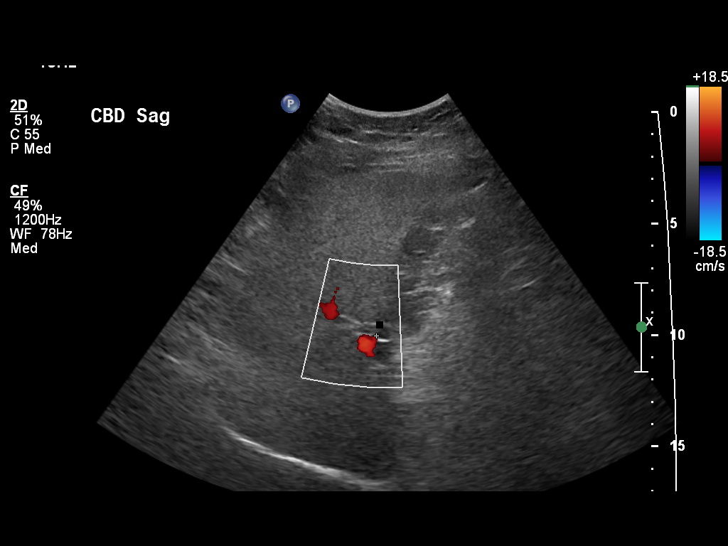
[im 41/49]
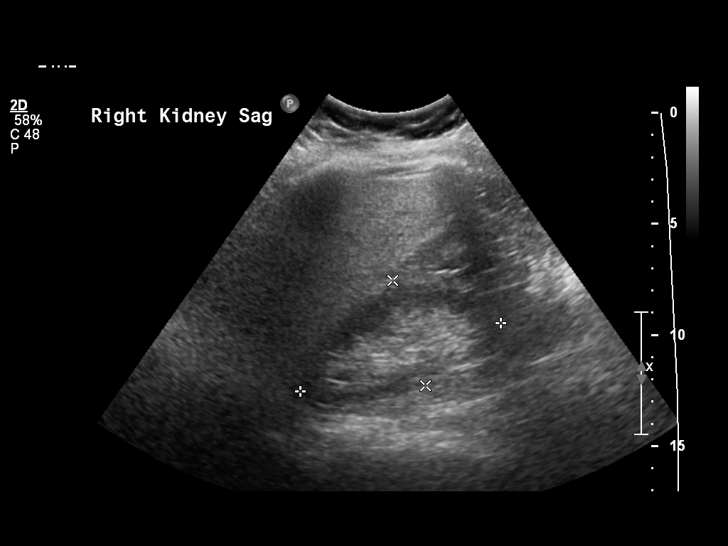
[im 45/49]
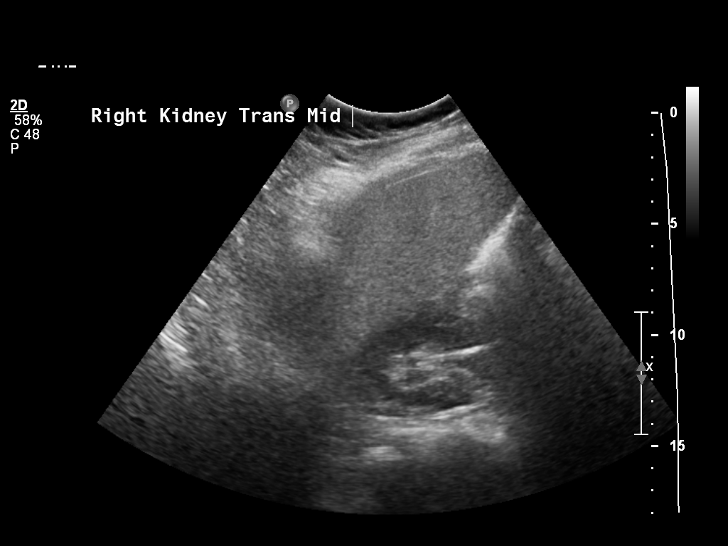
[im 49/49]
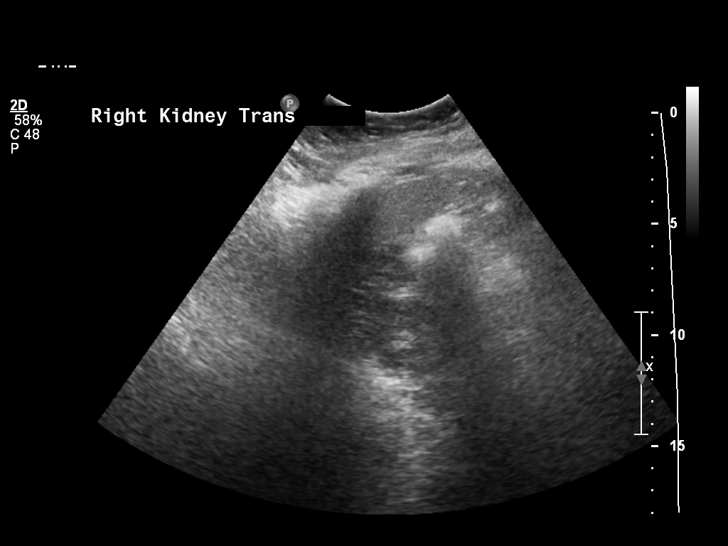

[14 of 25 positions shown; findings below may reference images not displayed]

FINDINGS: Liver is echogenic compatible with fatty infiltration. There is a 3 cm hepatic cyst. Fatty infiltration limits evaluation for focal hepatic mass. There is no intra or extrahepatic biliary ductal dilatation. Common bile duct measures 5 mm. Gallbladder is surgically absent. Pancreas is incompletely visualized due to artifact from overlying bowel gas. Right kidney measures 9.5 cm and is normal.

Visualized abdominal aorta is without aneurysmal dilatation. IVC is normal. Portal vein measures 13 mm in diameter and demonstrates hepatopetal flow. Hepatic veins are also patent. There is no ascites.
IMPRESSION: 1. Fatty liver. A 3 cm hepatic cyst.

2. Prior cholecystectomy. 

3. Pancreas incompletely visualized due to artifact from overlying bowel gas.

## 2021-06-15 IMAGING — MR MRI BRAIN W/O CONTRAST
6 of 9 series · 30 of 48 positions shown · IV contrast (gadolinium)
Comparison: None available.

﻿EXAM:  MRI BRAIN W/O CONTRAST
INDICATION: Migraine headaches.
TECHNIQUE: Multiplanar, multisequential MRI of the brain was performed without gadolinium contrast.

[Series 6: DWI · axial · 5.0mm · 1.35mm/px · z∈[-25,+101]mm · 4 of 22 slices shown (1 of 2)]
[im 1/22]
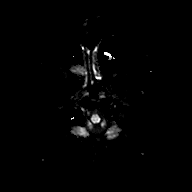
[im 8/22]
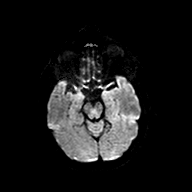
[im 15/22]
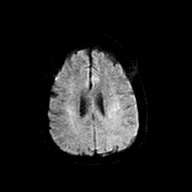
[im 22/22]
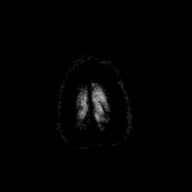

[Series 7: DWI · axial · 5.0mm · 1.35mm/px · z∈[-25,+101]mm · 5 of 20 slices shown (2 of 2)]
[im 1/20]
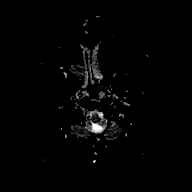
[im 5/20]
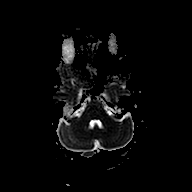
[im 10/20]
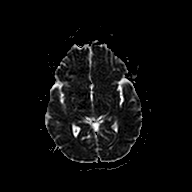
[im 15/20]
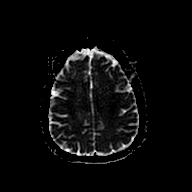
[im 20/20]
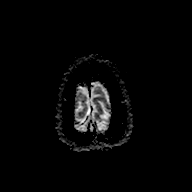

[Series 8: FLAIR · sagittal · 4.0mm · 0.75mm/px · 6 of 26 slices shown (1 of 2)]
[im 1/26]
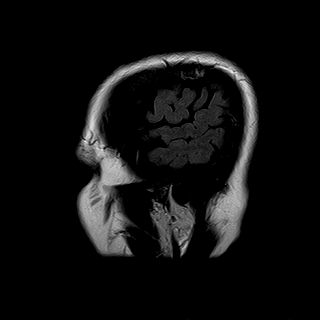
[im 6/26]
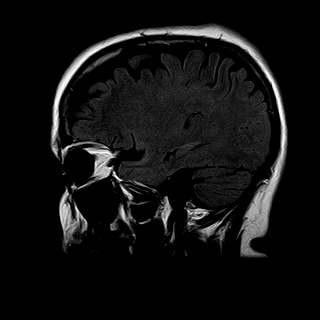
[im 11/26]
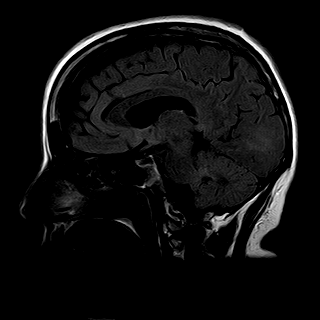
[im 16/26]
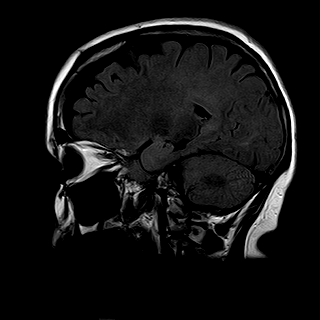
[im 21/26]
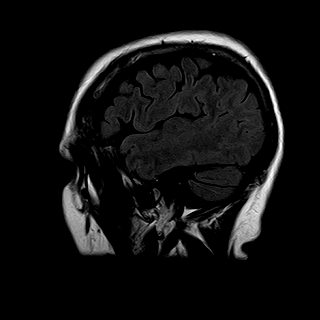
[im 26/26]
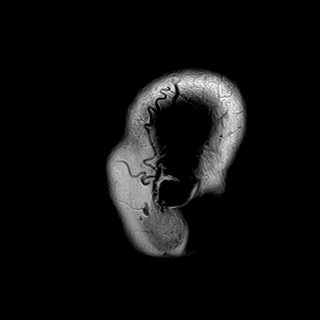

[Series 9: T2 · axial · 5.0mm · 0.43mm/px · z∈[-28,+116]mm · 6 of 25 slices shown (1 of 2)]
[im 1/25]
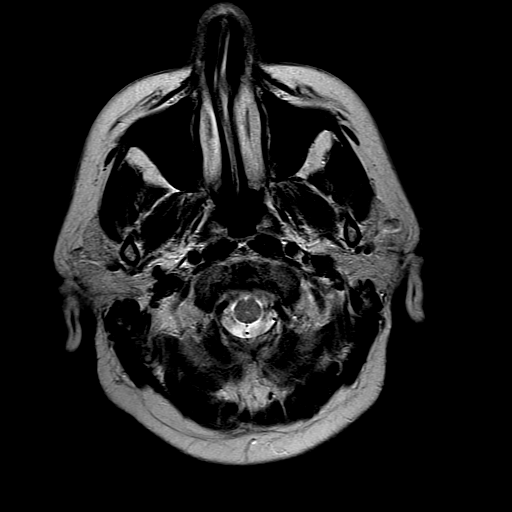
[im 5/25]
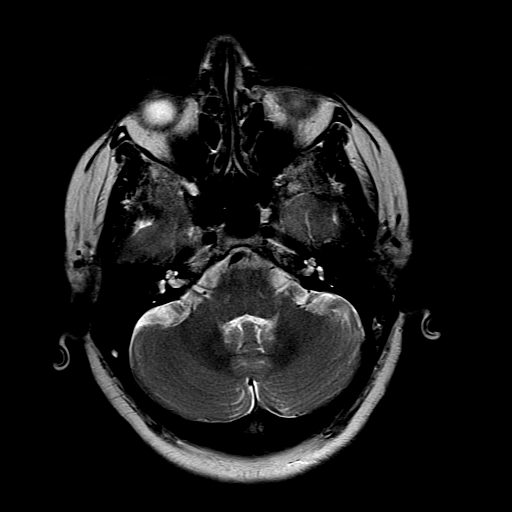
[im 10/25]
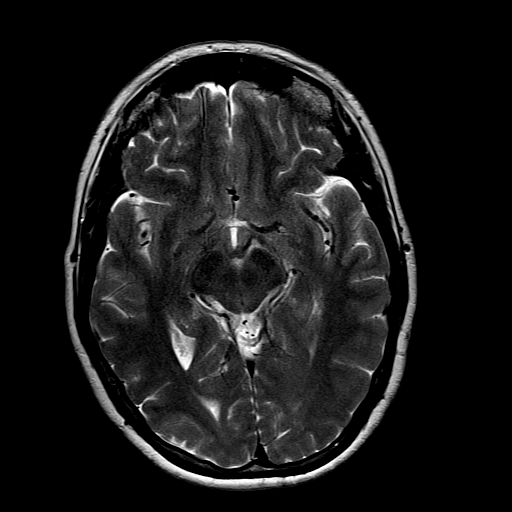
[im 15/25]
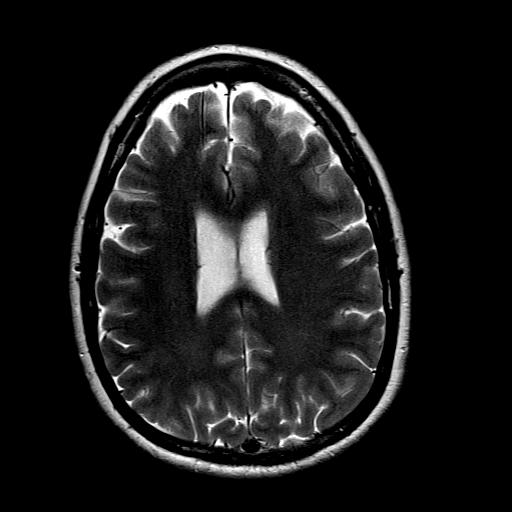
[im 20/25]
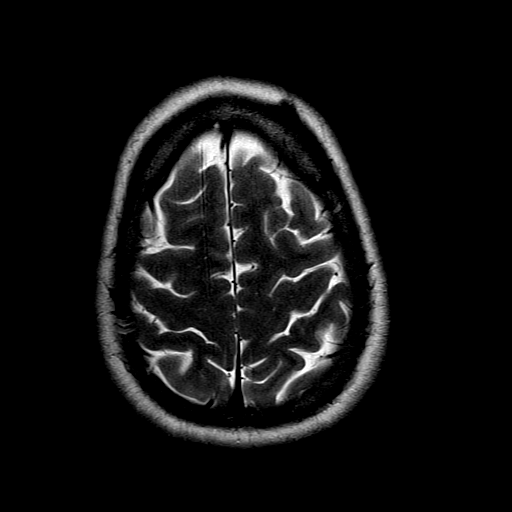
[im 25/25]
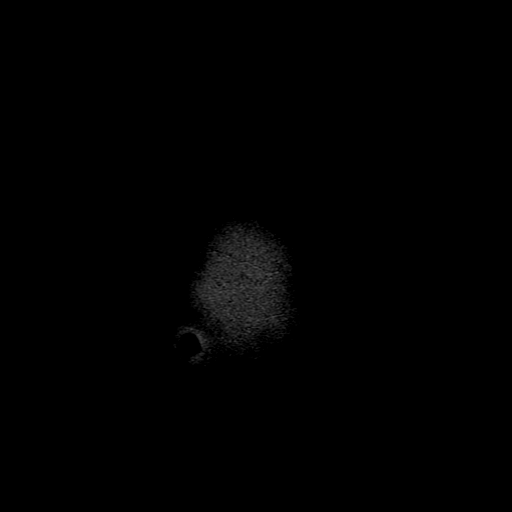

[Series 10: FLAIR · axial · 5.0mm · 0.43mm/px · z∈[-28,+116]mm · 6 of 25 slices shown (2 of 2)]
[im 1/25]
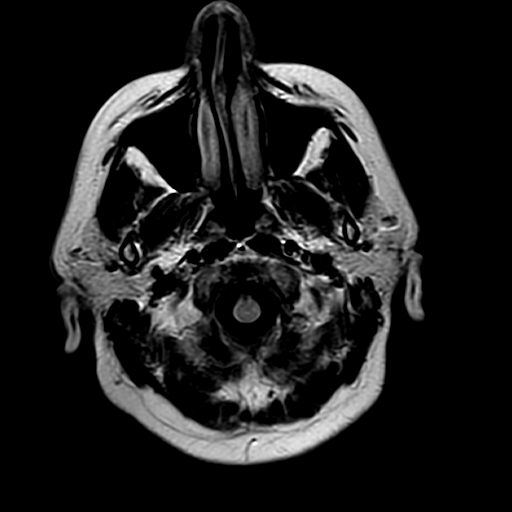
[im 5/25]
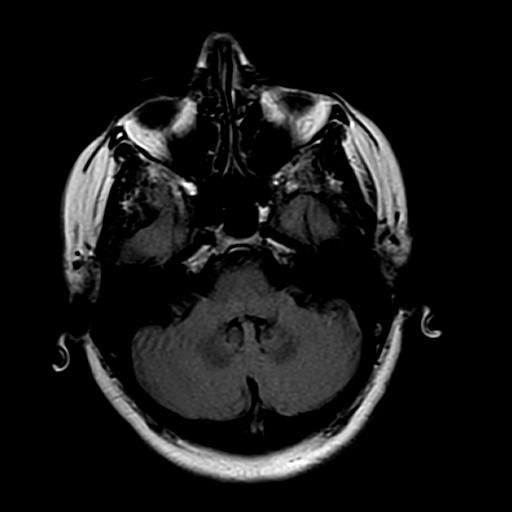
[im 10/25]
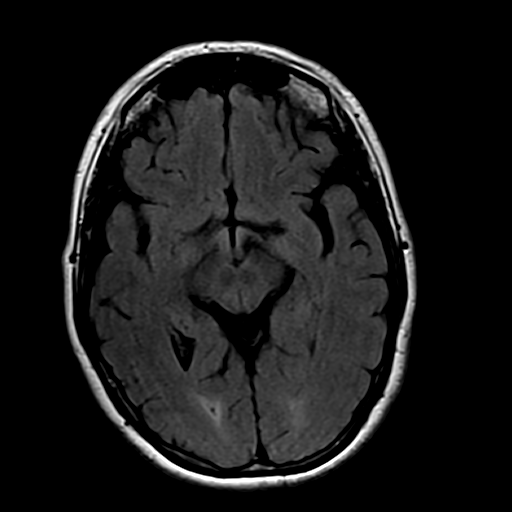
[im 15/25]
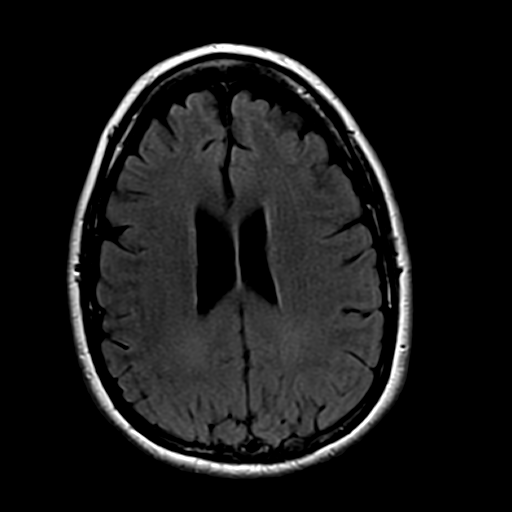
[im 20/25]
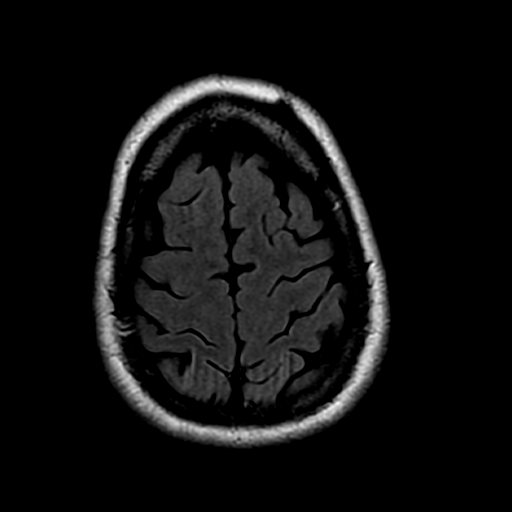
[im 25/25]
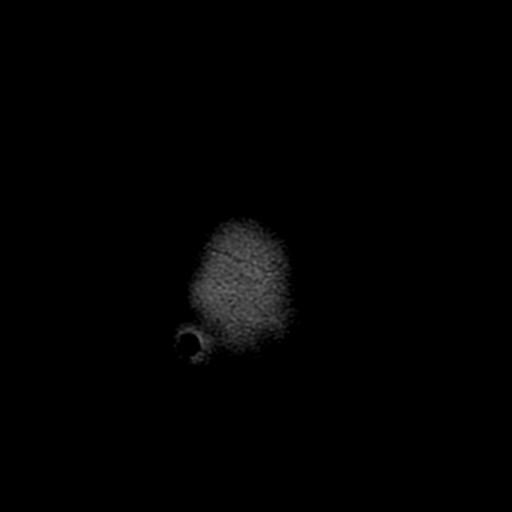

[Series 11: T2 · coronal · 6.0mm · 0.43mm/px · 3 of 24 slices shown (2 of 2)]
[im 1/24]
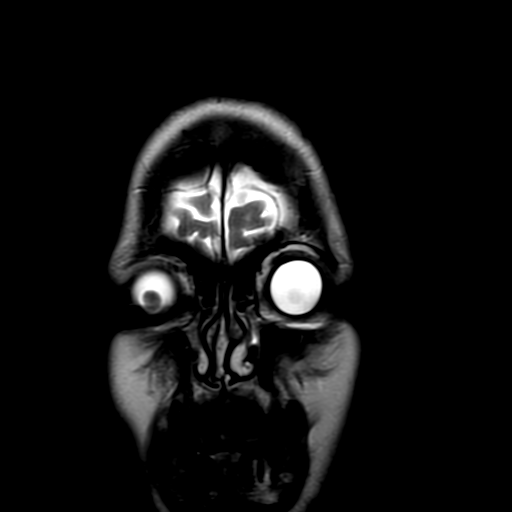
[im 5/24]
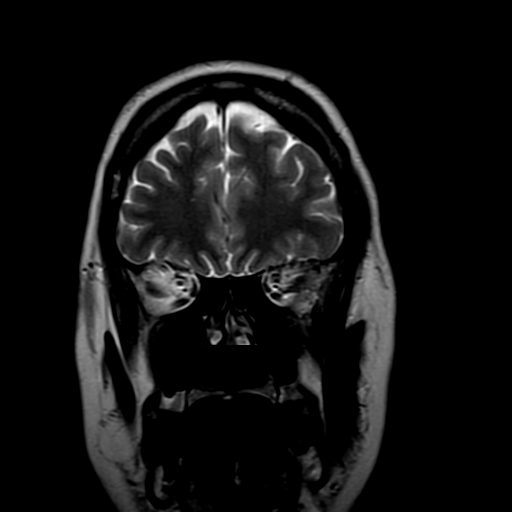
[im 10/24]
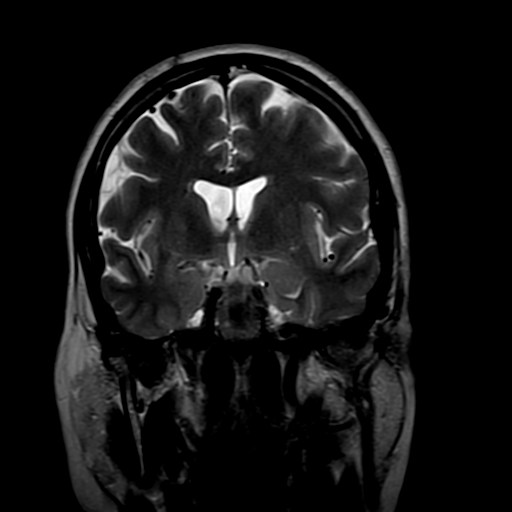

[30 of 48 positions shown; findings below may reference images not displayed]

FINDINGS: Ventricular and sulcal size is normal for the patient's age. There is no mass effect, midline shift or intracranial hemorrhage. There is no evidence of acute infarction or prior microhemorrhages. Skull base flow voids and basal cisterns are patent. Sagittal survey of midline structures is unremarkable. There are no extra-axial fluid collections. Visualized paranasal sinuses, mastoid air cells and orbital contents are unremarkable.
IMPRESSION: Unremarkable exam.

## 2021-06-15 IMAGING — MR MRI CERVICAL SPINE WITHOUT CONTRAST
4 of 5 series · 24 of 48 positions shown · IV contrast (gadolinium)
Comparison: None available.

﻿EXAM:  [id number]   MRI CERVICAL SPINE WITHOUT CONTRAST
INDICATION: Chronic neck pain.
TECHNIQUE: Multiplanar multisequential MRI of the cervical spine was performed without gadolinium contrast.

[Series 5: T2 · sagittal · 3.0mm · 0.75mm/px · 8 of 15 slices shown (1 of 2)]
[im 1/15]
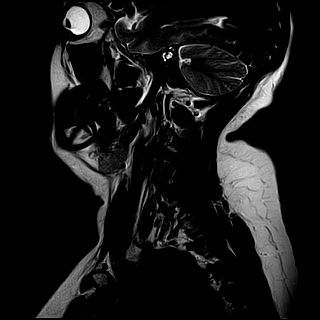
[im 3/15]
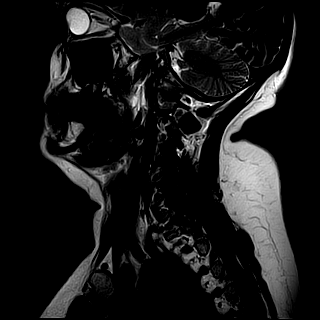
[im 5/15]
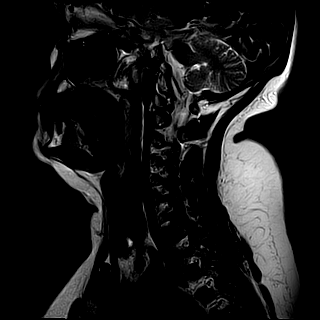
[im 7/15]
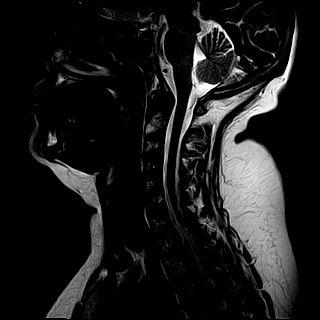
[im 9/15]
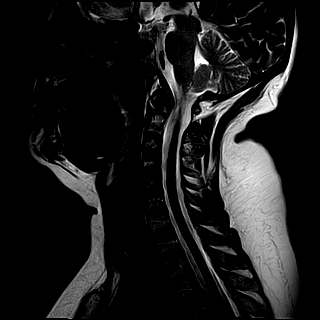
[im 11/15]
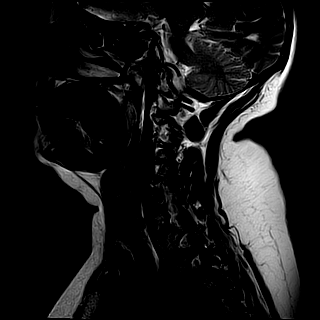
[im 13/15]
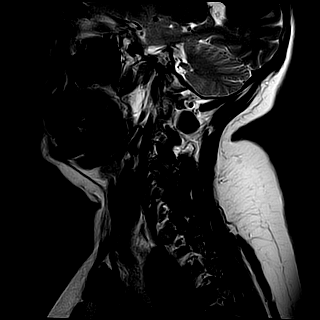
[im 15/15]
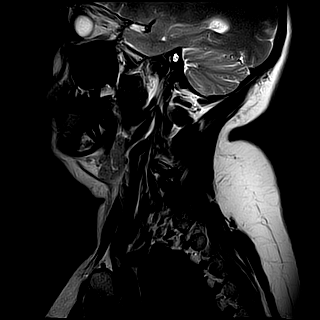

[Series 6: T1 · sagittal · 3.0mm · 0.47mm/px · 3 of 15 slices shown]
[im 2/15]
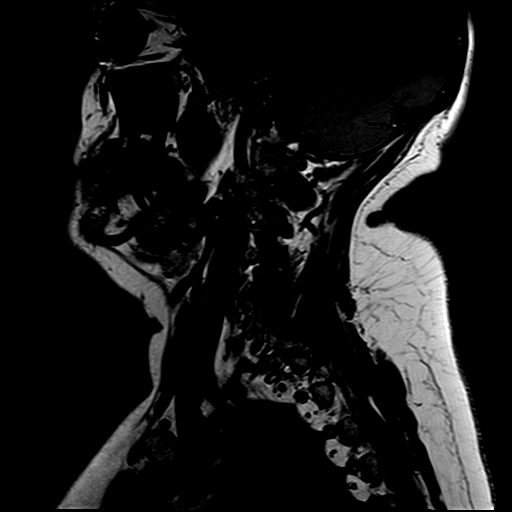
[im 8/15]
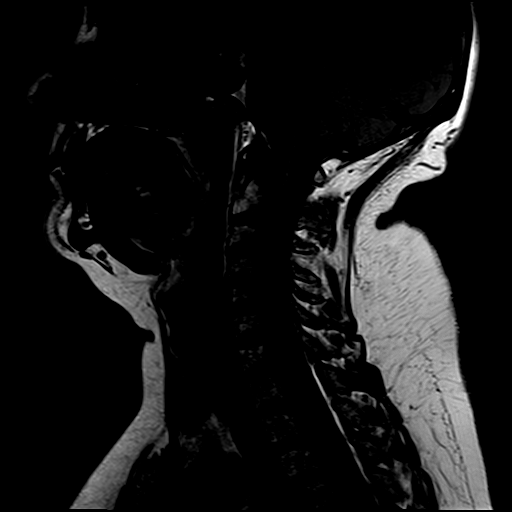
[im 13/15]
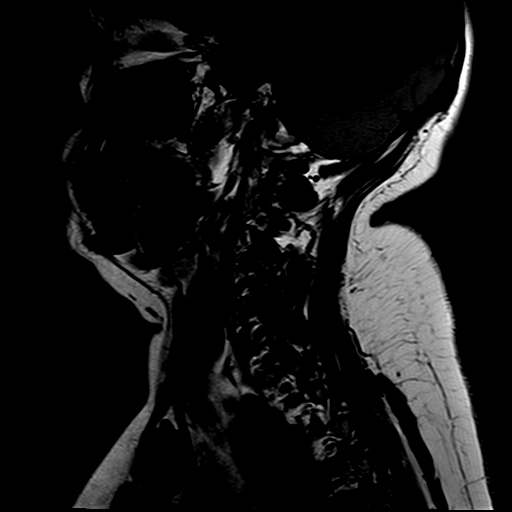

[Series 7: STIR · sagittal · 3.0mm · 0.47mm/px · 3 of 15 slices shown]
[im 2/15]
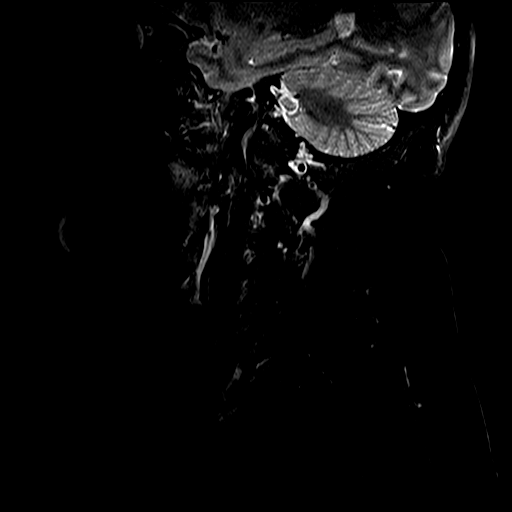
[im 8/15]
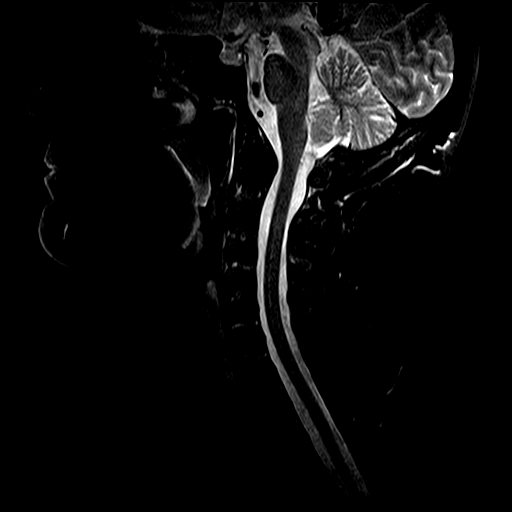
[im 13/15]
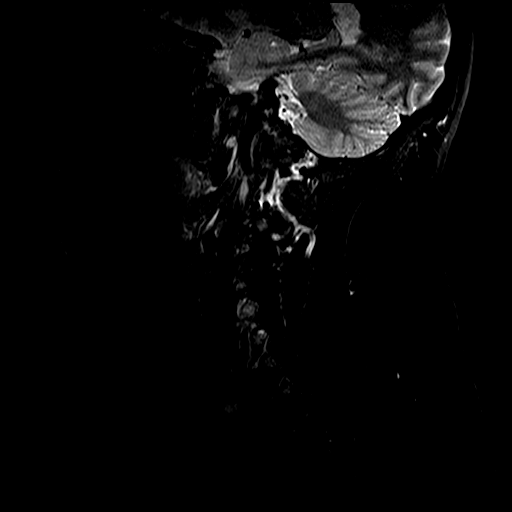

[Series 9: T2 · axial · 3.0mm · 0.39mm/px · z∈[-84,+0]mm · 10 of 18 slices shown (2 of 2)]
[im 1/18]
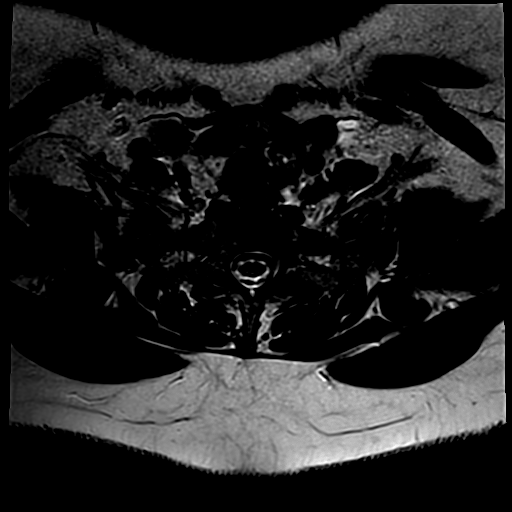
[im 2/18]
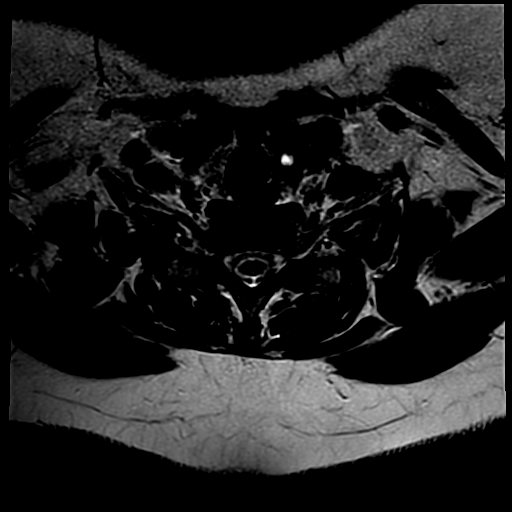
[im 4/18]
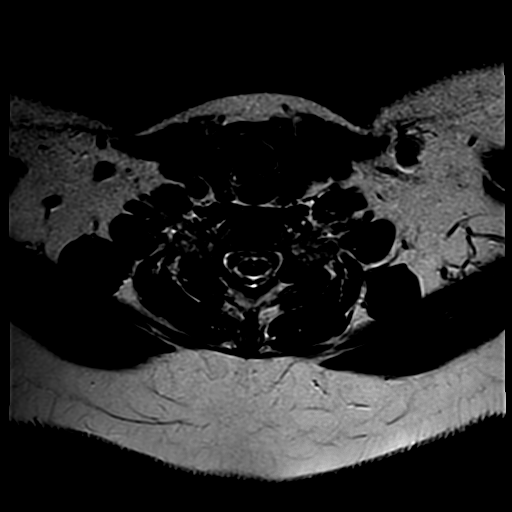
[im 6/18]
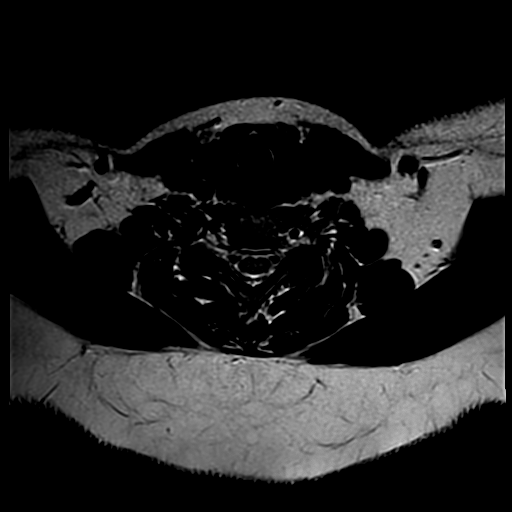
[im 7/18]
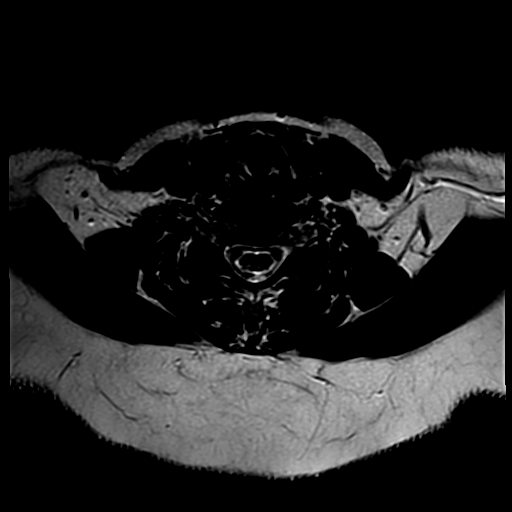
[im 9/18]
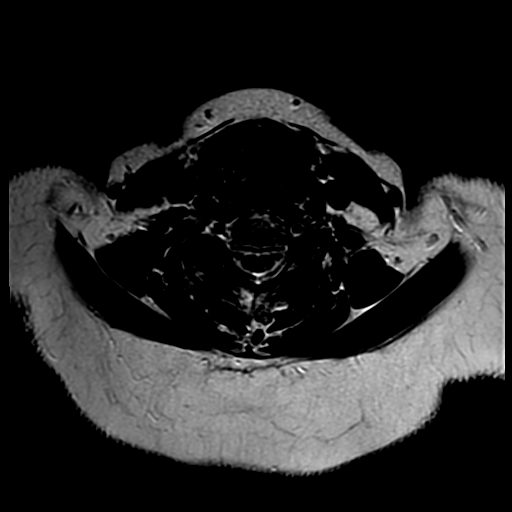
[im 11/18]
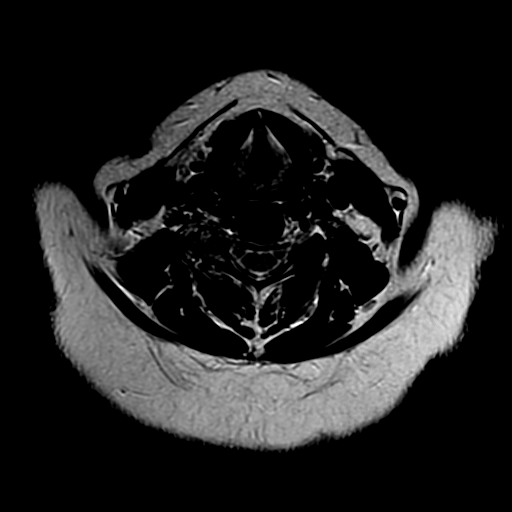
[im 12/18]
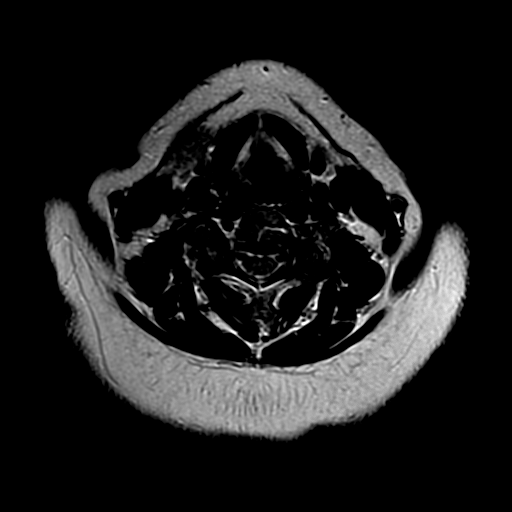
[im 14/18]
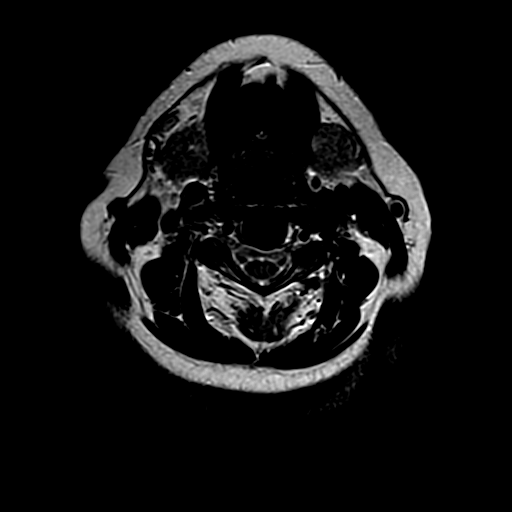
[im 16/18]
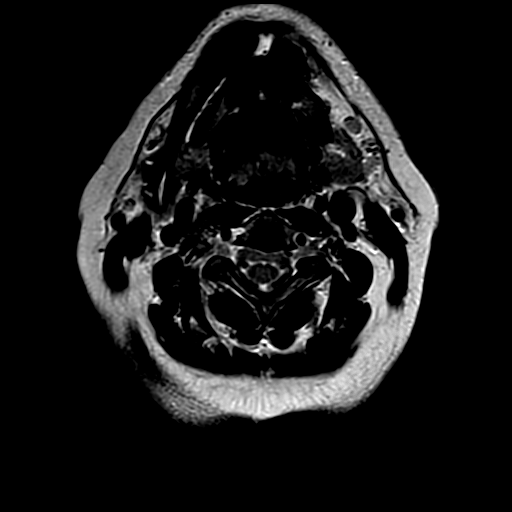

[24 of 48 positions shown; findings below may reference images not displayed]

FINDINGS: Vertebral bodies are normal in height, alignment and signal intensity. There is no acute fracture or subluxation. Visualized spinal cord is normal in signal intensity without evidence of compression at any level.

There is no significant disc herniation, spinal canal or neural foraminal stenosis at any level.

Paraspinal soft tissues are also unremarkable. There is suggestion of small left thyroid nodule.
IMPRESSION: 1. Essentially unremarkable cervical spine. 

2. Suggestion of small left thyroid nodule. Follow-up nonemergent thyroid sonogram is recommended for confirmation.

## 2021-08-30 IMAGING — US US THYROID
1 series · 14 of 25 positions shown · non-contrast
Comparison: None available.

﻿EXAM:  [id number]   US THYROID
INDICATION: Thyroid nodule.

[Series 1: us thyroid · 14 of 68 slices shown]
[im 1/68]
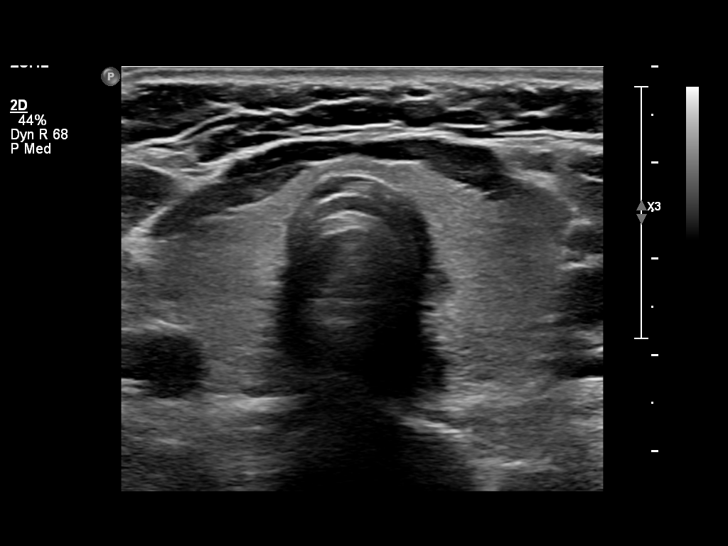
[im 6/68]
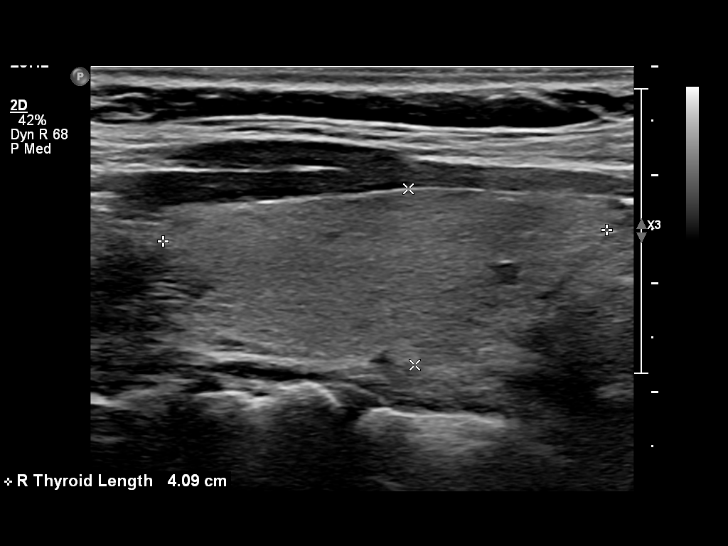
[im 12/68]
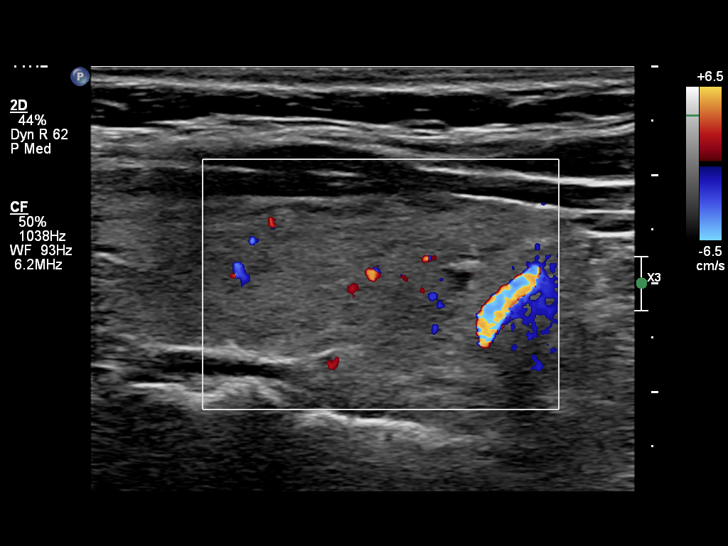
[im 17/68]
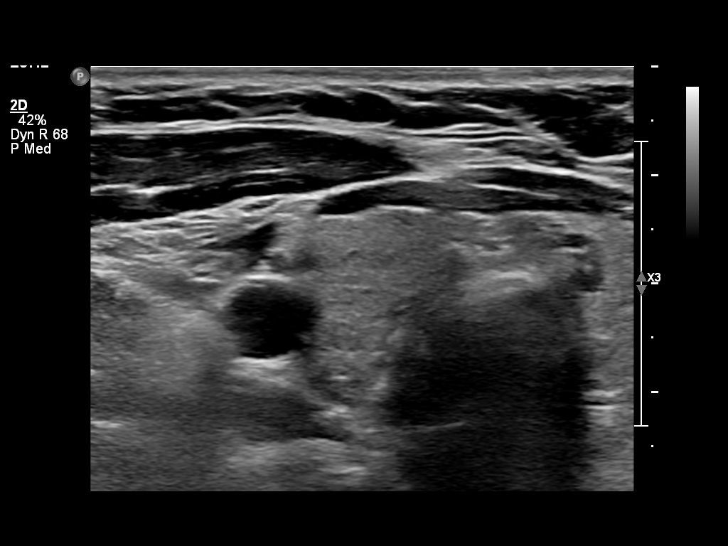
[im 23/68]
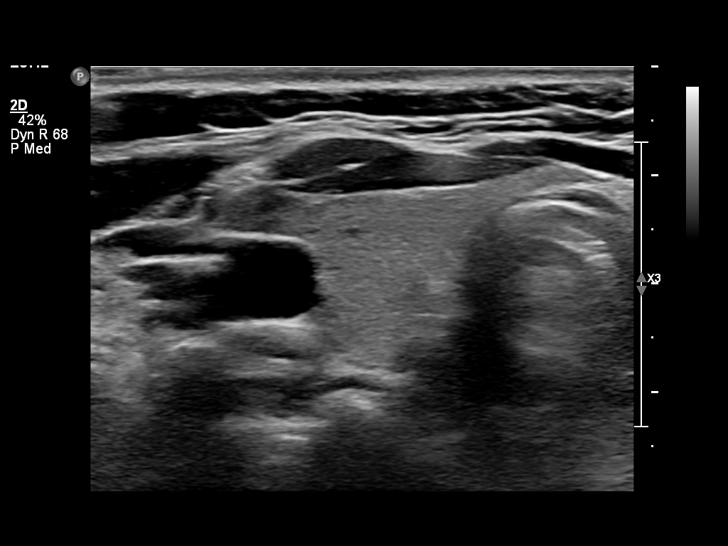
[im 26/68]
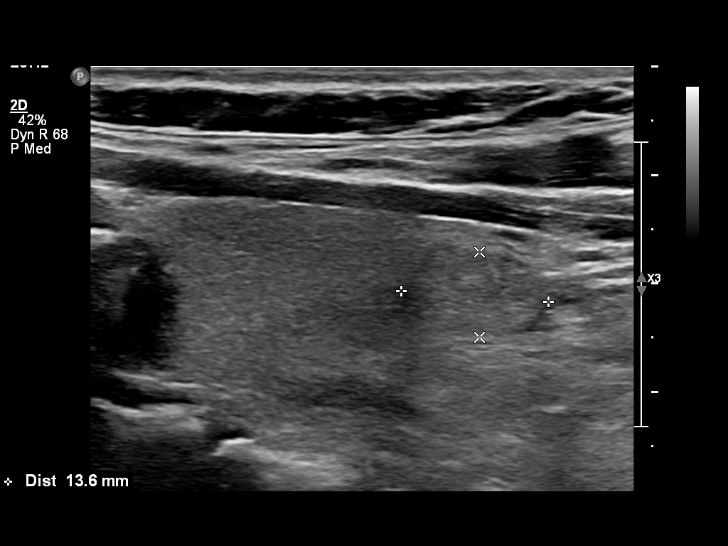
[im 31/68]
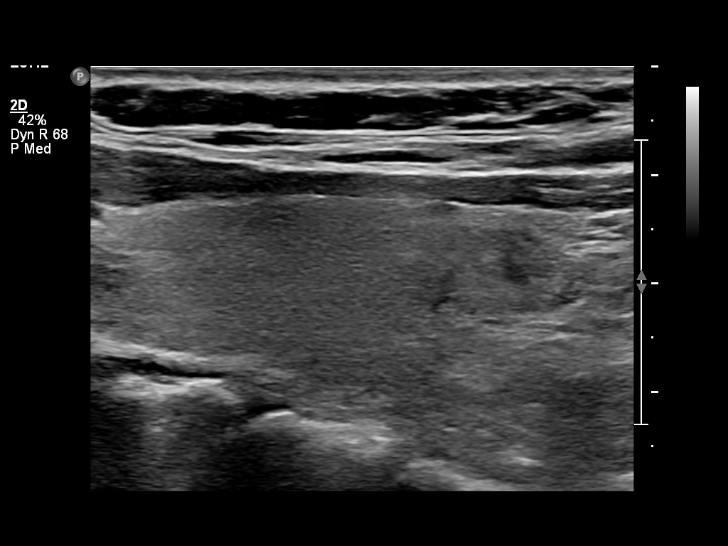
[im 37/68]
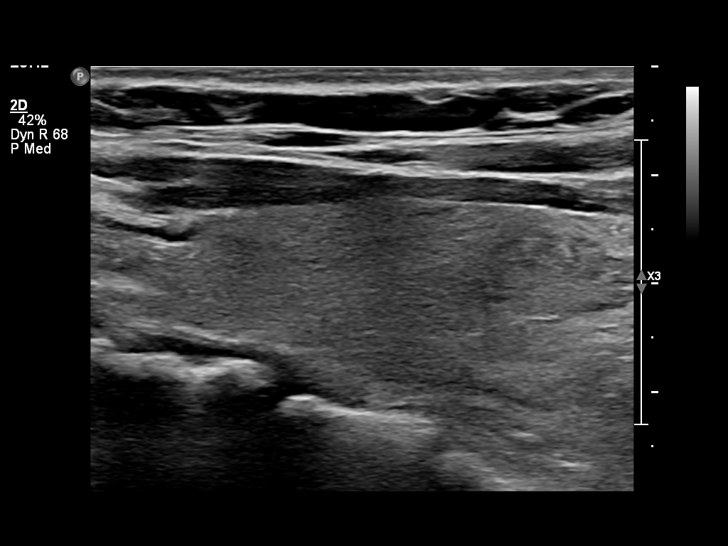
[im 42/68]
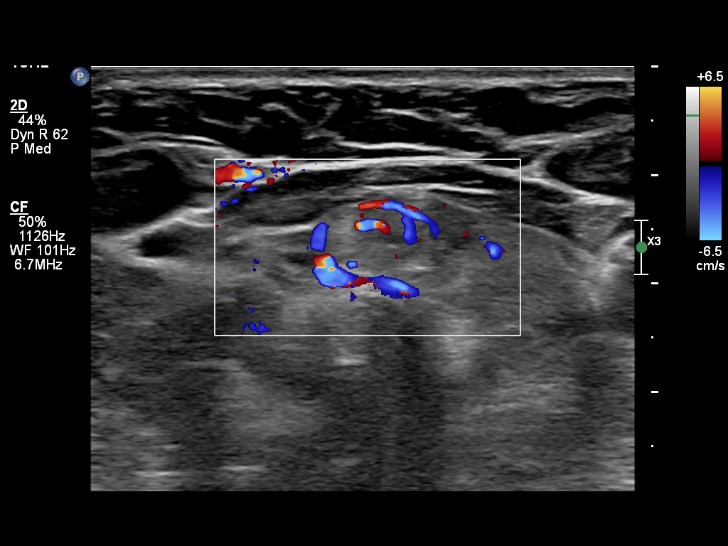
[im 45/68]
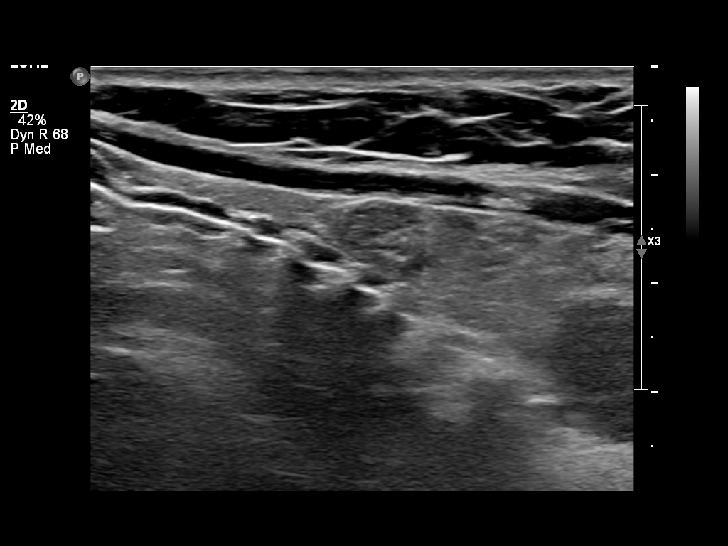
[im 51/68]
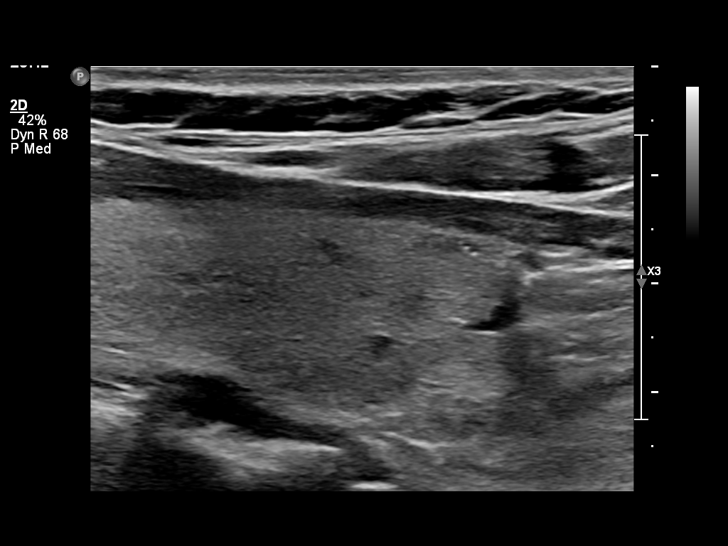
[im 56/68]
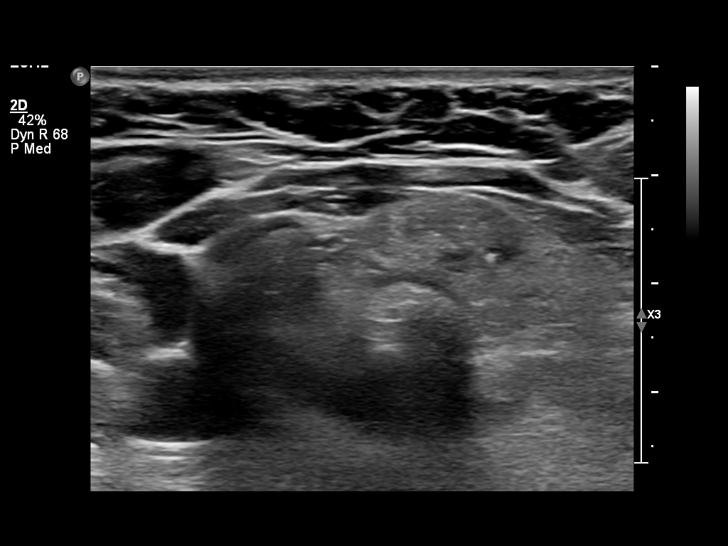
[im 62/68]
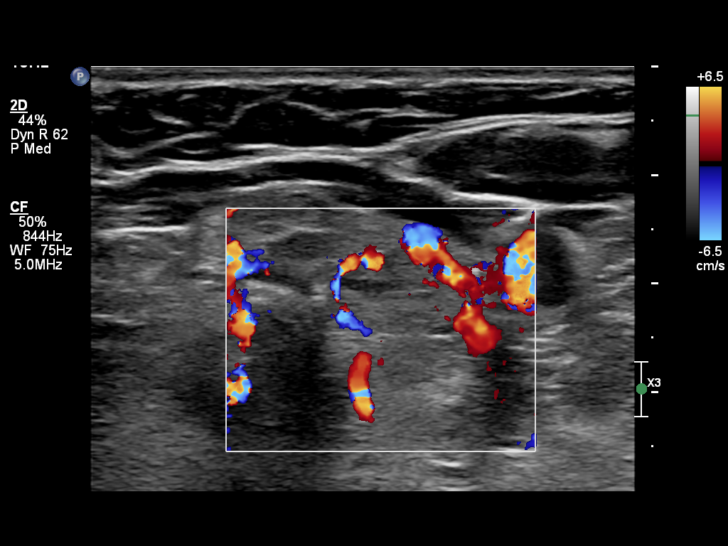
[im 68/68]
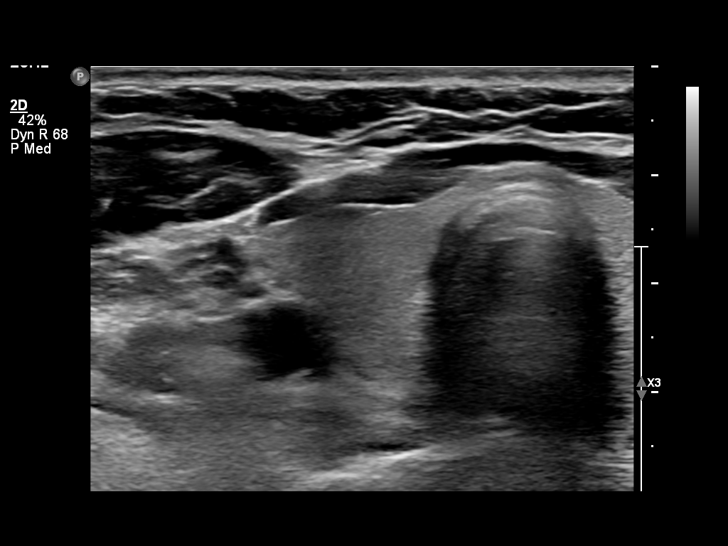

[14 of 25 positions shown; findings below may reference images not displayed]

FINDINGS: Thyroid gland is heterogeneous in echogenicity. Right lobe measures 4.1 x 1.6 x 1.5 cm and left lobe measures 4.4 x 1.9 x 1.5 cm. There is a 14 mm hypoechoic nodule within the superior left lobe close to the isthmus. There is another 14 mm isoechoic nodule within the inferior left lobe.
IMPRESSION: Small thyroid nodules as detailed above.

## 2021-11-17 IMAGING — US ABD LIMITED
1 series · 14 of 25 positions shown · non-contrast
Comparison: 04/30/2020.

﻿EXAM:  BASSETO PROFESSIONAL READ ABD U/S LMTD
INDICATION: Fatty liver.

[Series 1: abd limited · 14 of 56 slices shown]
[im 1/56]
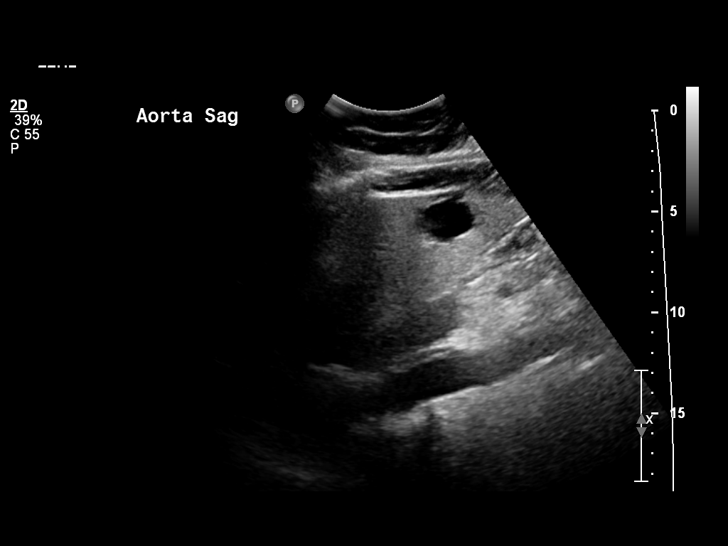
[im 5/56]
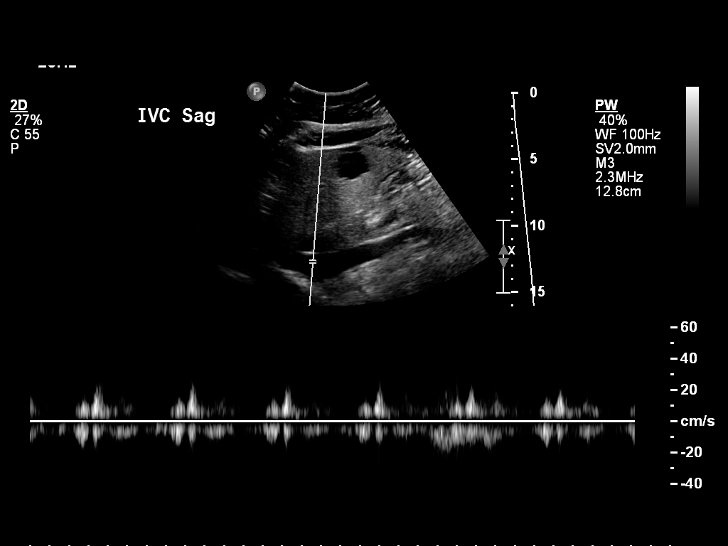
[im 10/56]
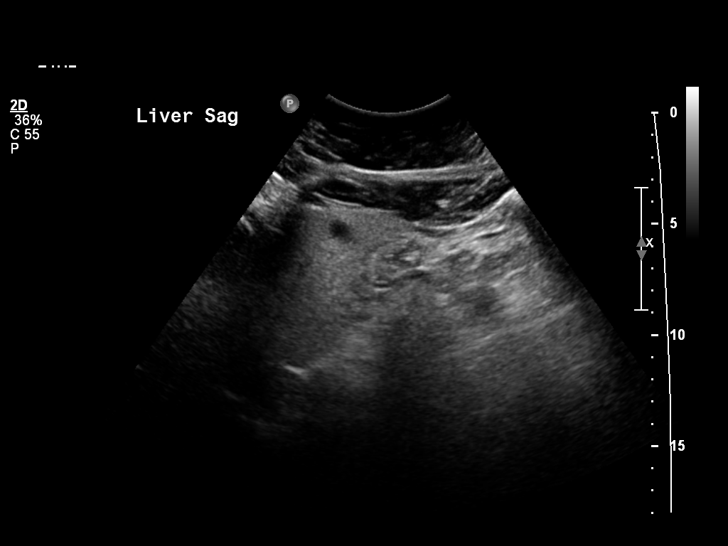
[im 14/56]
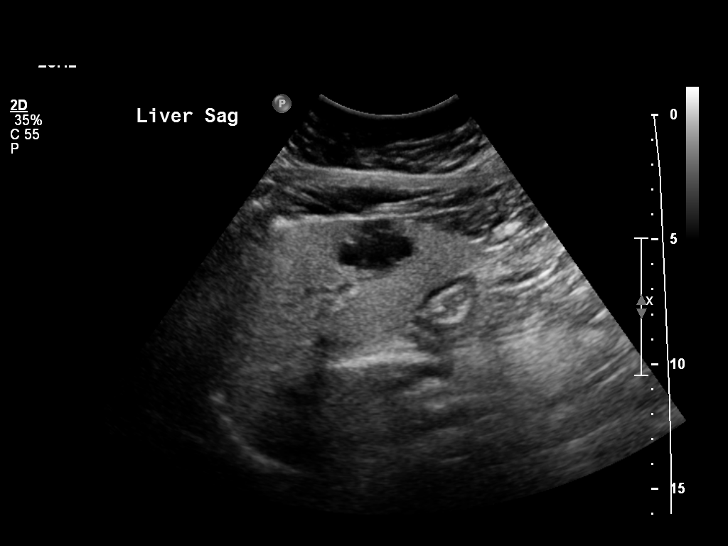
[im 19/56]
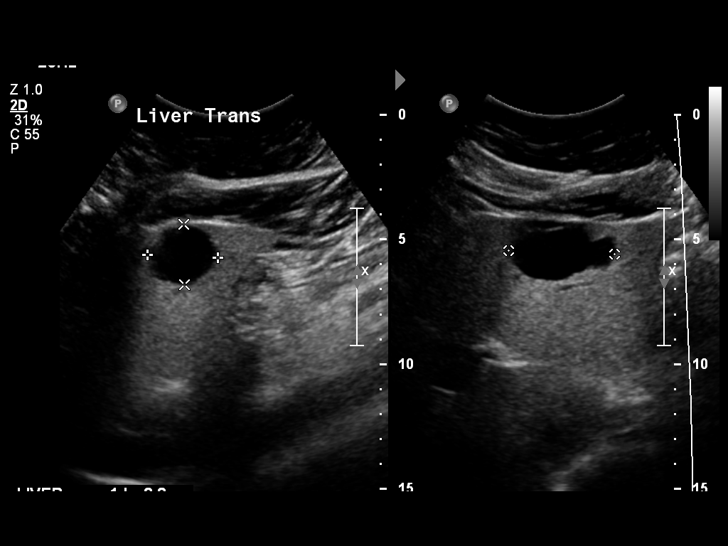
[im 21/56]
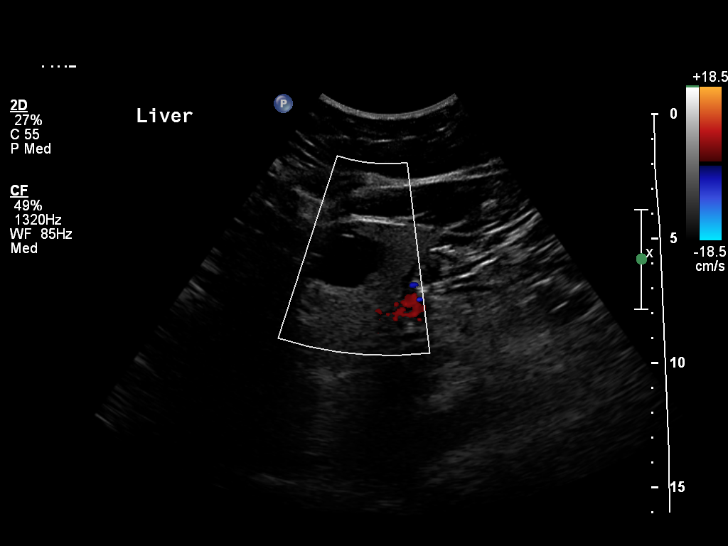
[im 26/56]
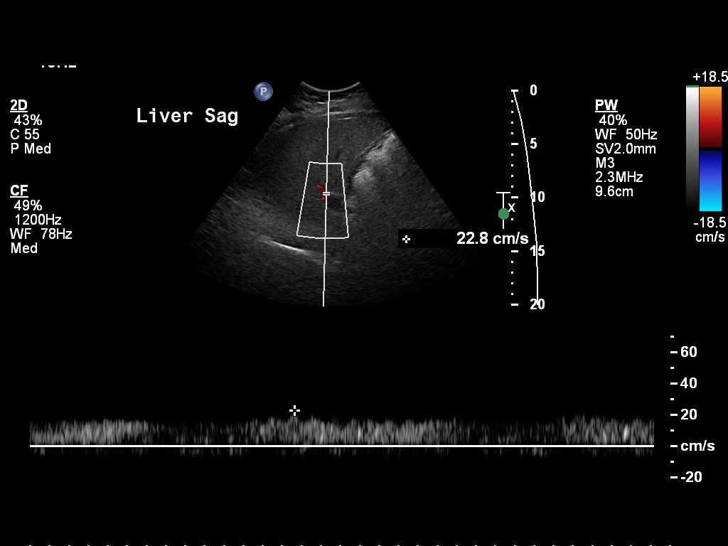
[im 30/56]
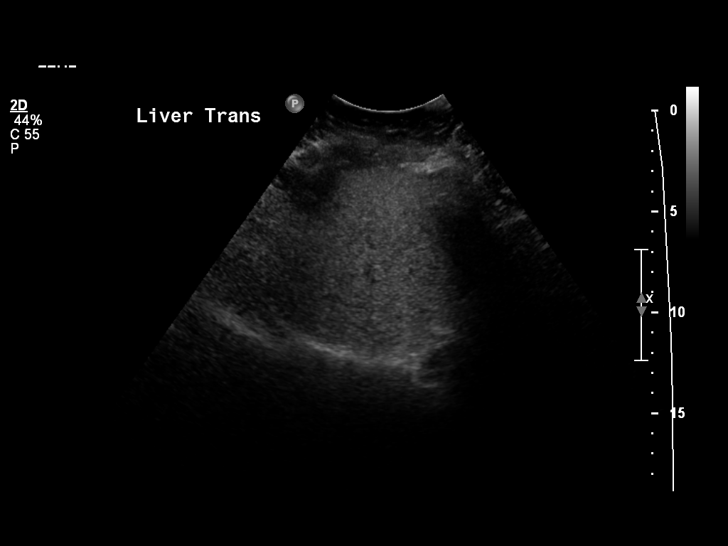
[im 35/56]
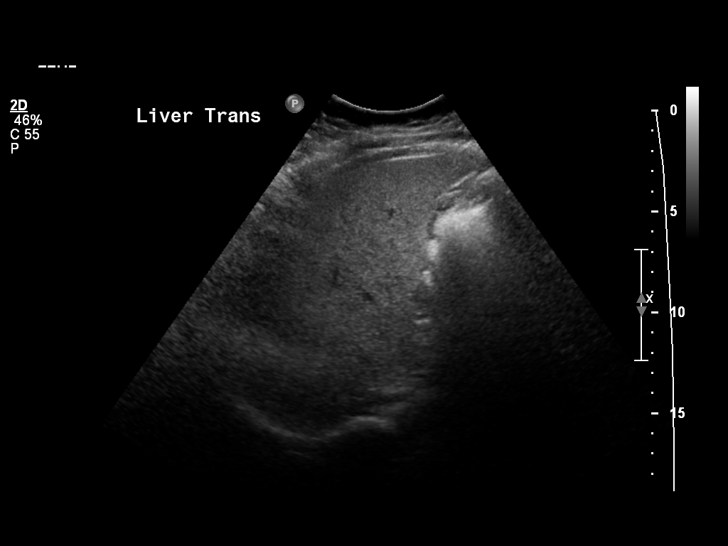
[im 37/56]
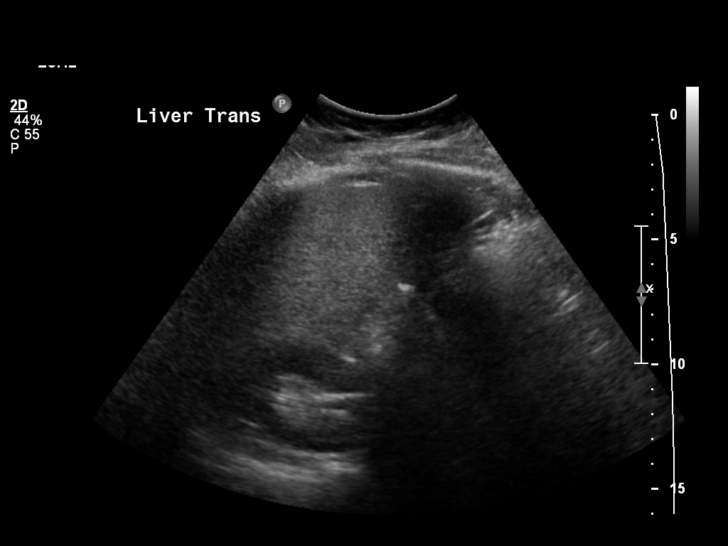
[im 42/56]
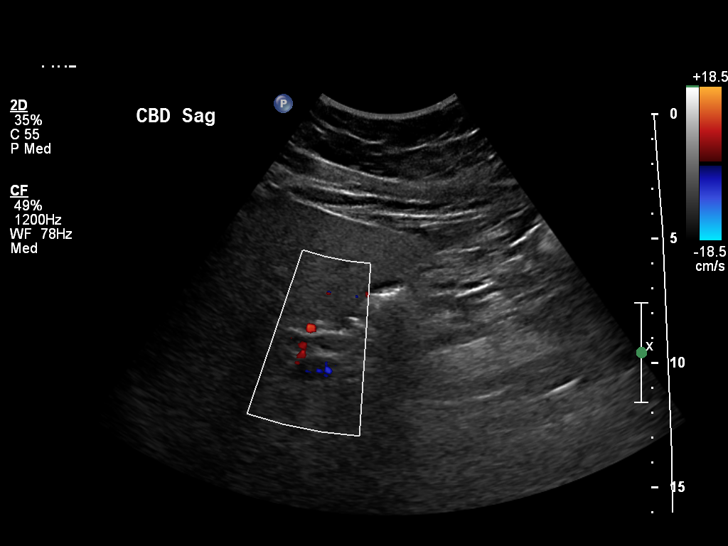
[im 46/56]
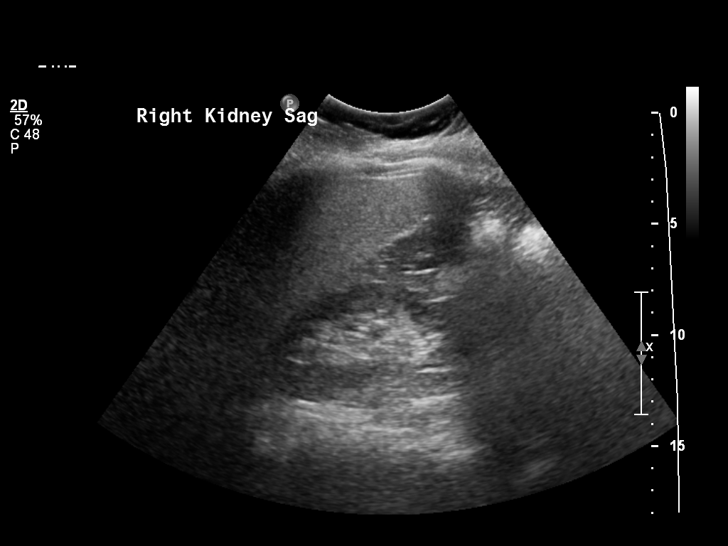
[im 51/56]
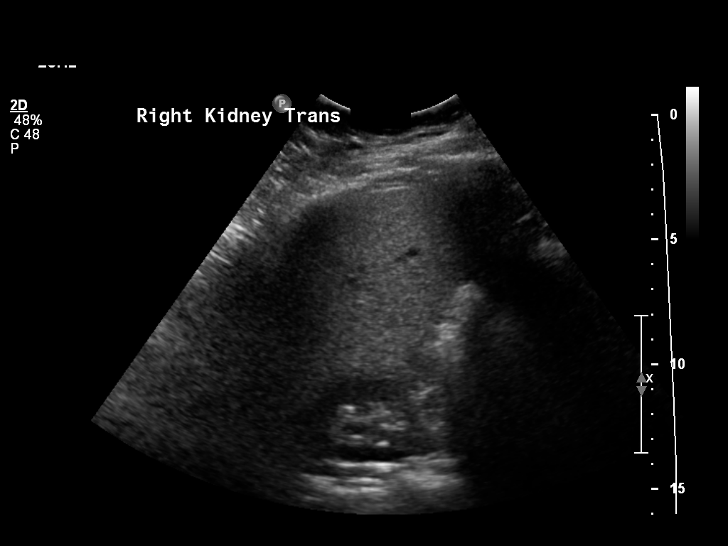
[im 56/56]
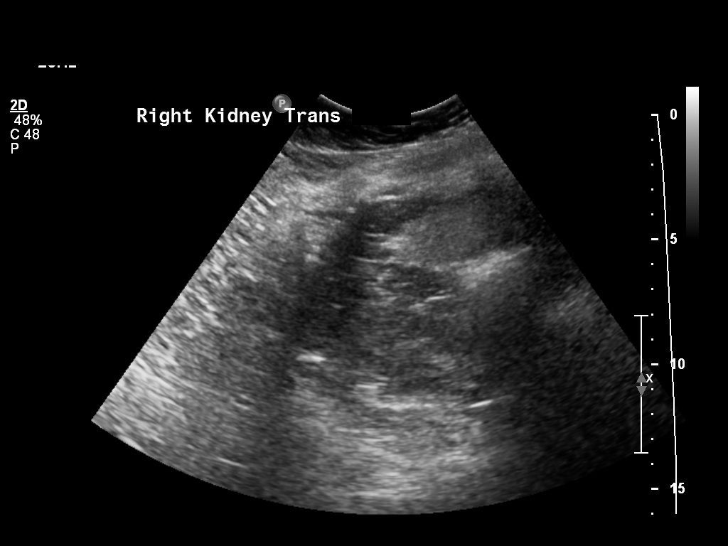

[14 of 25 positions shown; findings below may reference images not displayed]

FINDINGS: Liver is echogenic compatible with fatty infiltration. A 4 cm hepatic cyst is again identified. Fatty infiltration limits evaluation for focal hepatic mass. There is no intra or extrahepatic biliary ductal dilatation. Common bile duct measures 6 mm. Gallbladder is surgically absent. Pancreas is incompletely visualized due to artifact from overlying bowel gas. Right kidney measures 11.5 cm and is normal.

Visualized abdominal aorta is without aneurysmal dilatation. IVC is normal. Portal vein measures 11 mm in diameter and demonstrates hepatopetal flow. Hepatic veins are also patent. There is no ascites.
IMPRESSION: 1. Fatty liver. Stable 4 cm hepatic cyst.

2. Prior cholecystectomy. 

3. Pancreas incompletely visualized due to artifact from overlying bowel gas.

## 2021-12-30 IMAGING — US US ABDOMEN COMPLETE
1 series · 14 of 25 positions shown · non-contrast
Comparison: 06/10/2021.

﻿EXAM: US ABDOMEN COMPLETE
INDICATION: Right upper quadrant pain, fatty liver.

[Series 1: us abdomen complete · 14 of 62 slices shown]
[im 1/62]
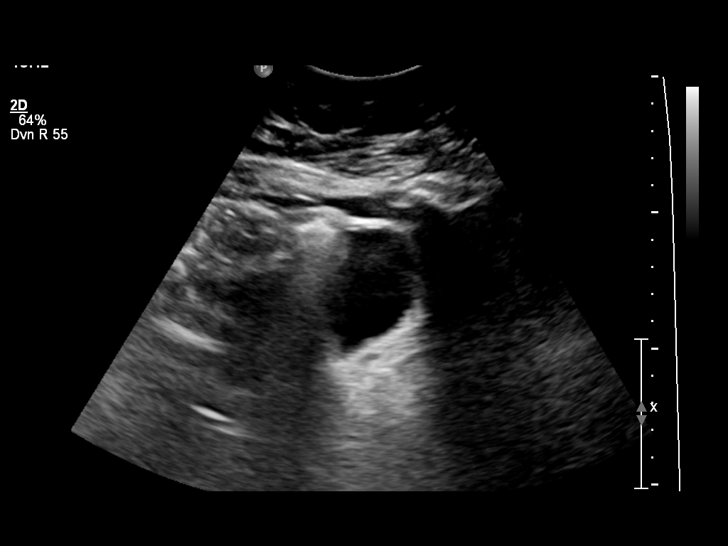
[im 6/62]
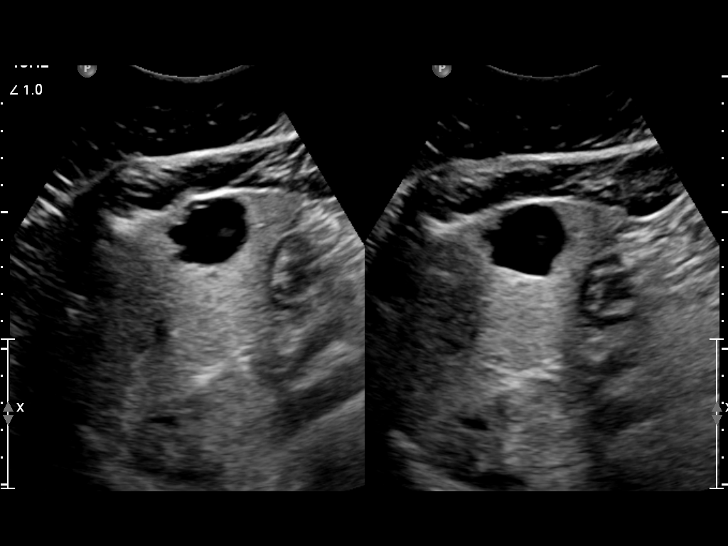
[im 11/62]
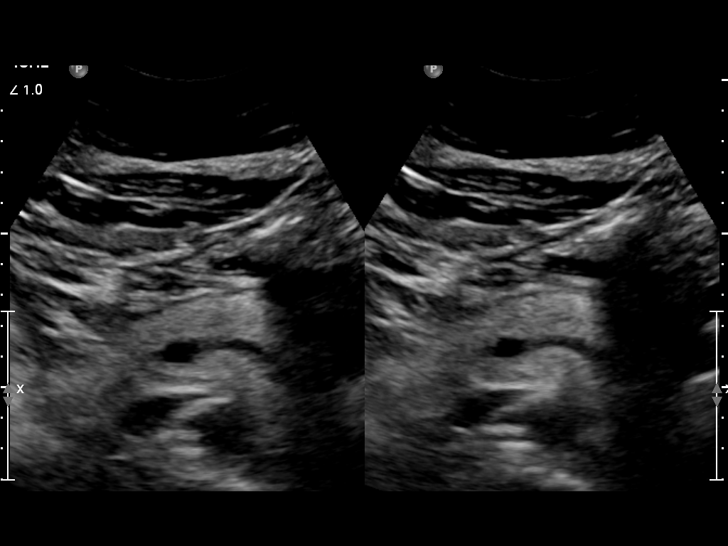
[im 16/62]
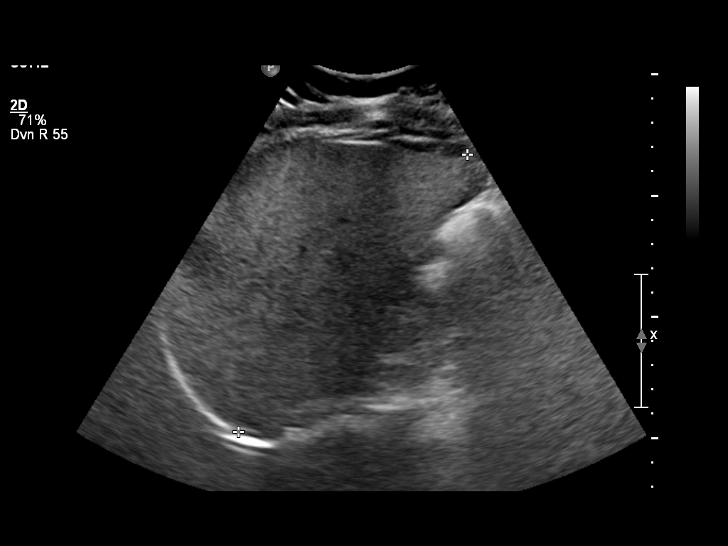
[im 21/62]
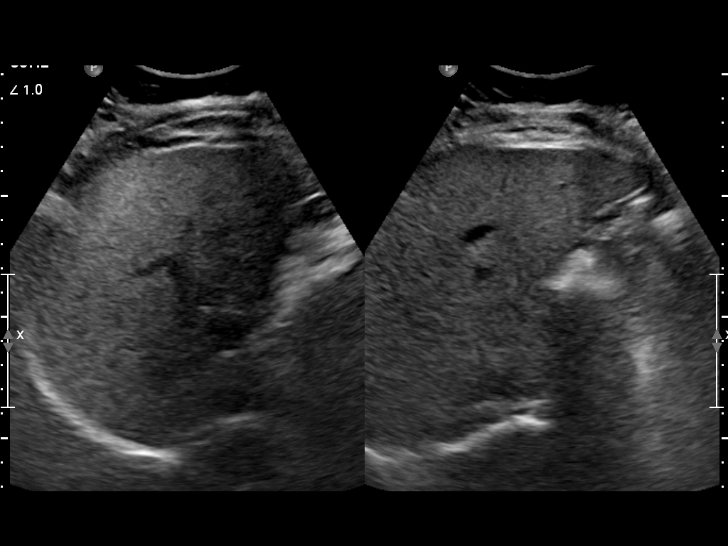
[im 23/62]
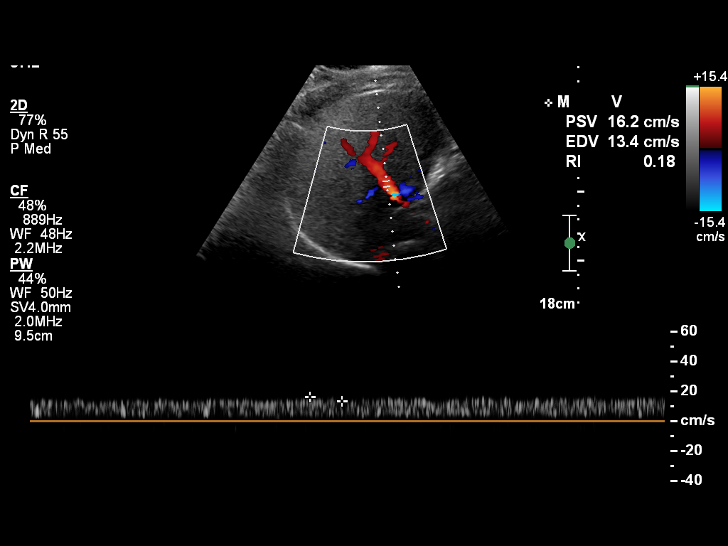
[im 28/62]
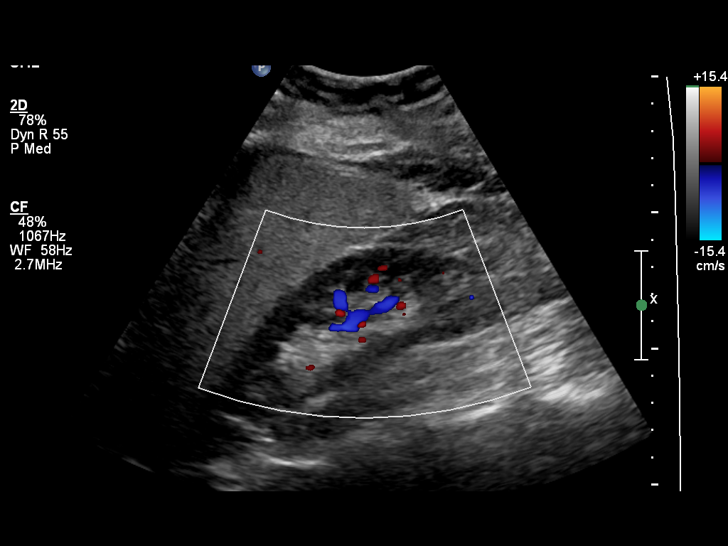
[im 34/62]
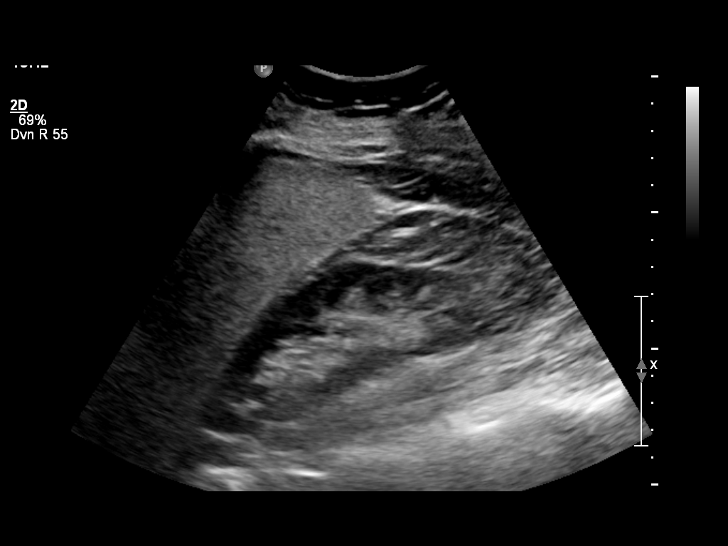
[im 39/62]
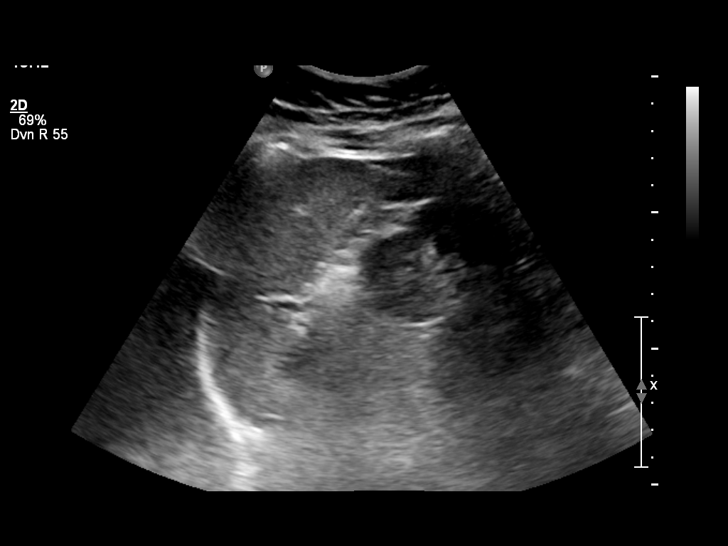
[im 41/62]
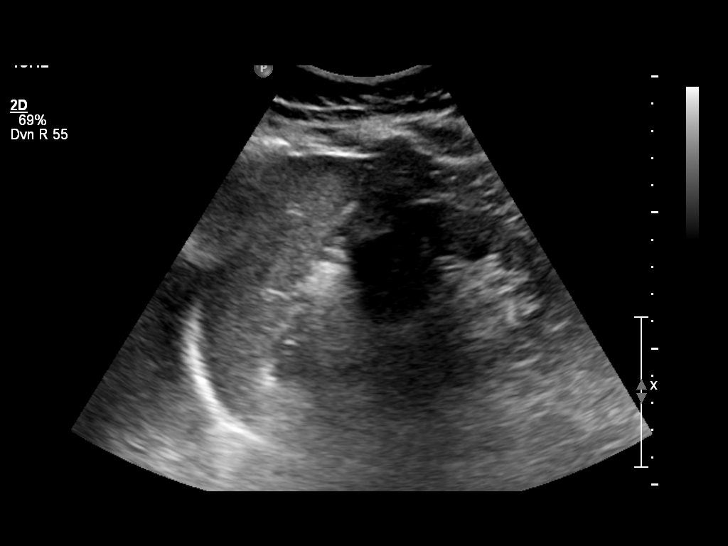
[im 46/62]
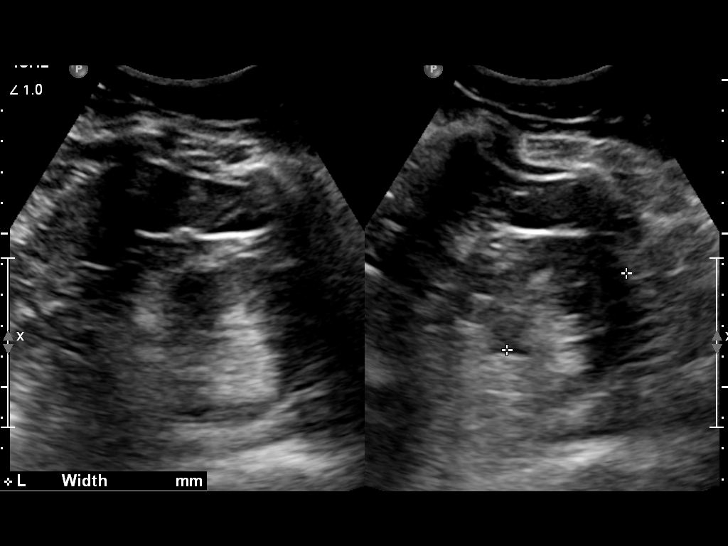
[im 51/62]
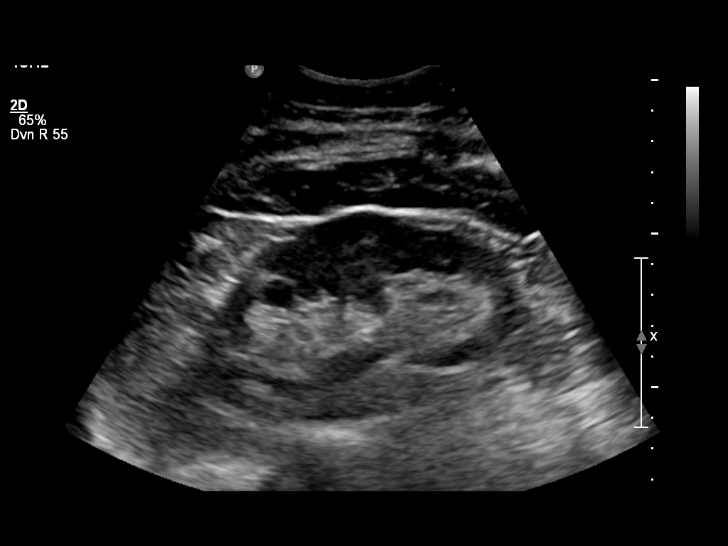
[im 56/62]
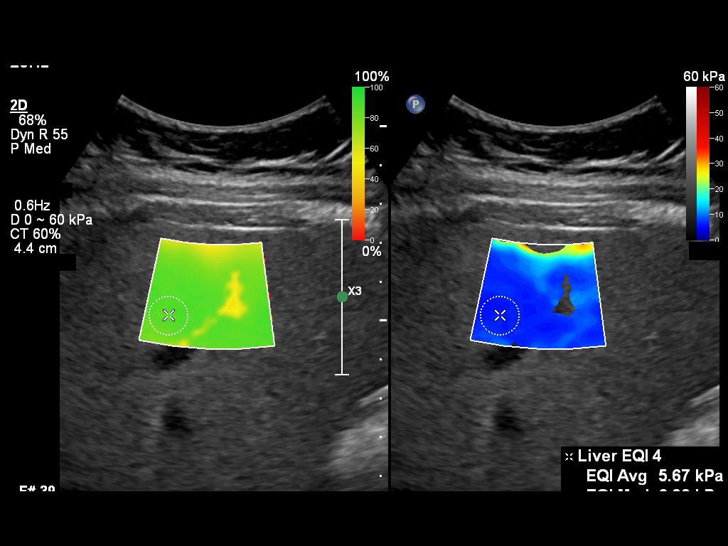
[im 62/62]
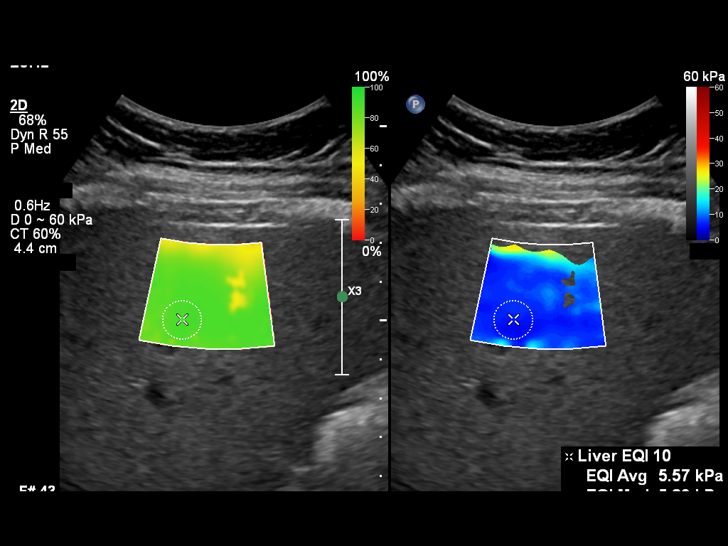

[14 of 25 positions shown; findings below may reference images not displayed]

FINDINGS: Liver is echogenic compatible with fatty infiltration.  Fatty infiltration limits evaluation for focal hepatic mass. A 4 cm left hepatic lobe cyst is again noted. There is no intra or extrahepatic biliary ductal dilation. Common bile duct measures 6 mm.  Gallbladder is surgically absent. Pancreas is incompletely visualized due to artifact from overlying bowel gas. Spleen measures 10.5 cm and is unremarkable.

Kidneys are normal in echogenicity and measure 11 cm bilaterally. There is no hydronephrosis, mass or cyst on either side.

Visualized abdominal aorta is without aneurysmal dilatation. IVC is normal. There is no ascites.
IMPRESSION: 1. Stable fatty liver and 4 cm left hepatic lobe cyst.

2. Prior cholecystectomy.

3. Pancreas incompletely visualized due to artifact from overlying bowel gas.

## 2022-04-01 ENCOUNTER — Other Ambulatory Visit: Payer: Self-pay

## 2022-04-01 ENCOUNTER — Inpatient Hospital Stay (HOSPITAL_COMMUNITY): Payer: Medicare Other | Admitting: Internal Medicine

## 2022-04-01 ENCOUNTER — Inpatient Hospital Stay
Admission: EM | Admit: 2022-04-01 | Discharge: 2022-04-04 | DRG: 392 | Disposition: A | Discharge status: Home / Self Care | Payer: Medicare Other | Attending: Family Medicine | Admitting: Family Medicine

## 2022-04-01 ENCOUNTER — Emergency Department (HOSPITAL_COMMUNITY): Payer: Medicare Other

## 2022-04-01 ENCOUNTER — Encounter (HOSPITAL_COMMUNITY): Payer: Self-pay

## 2022-04-01 DIAGNOSIS — N390 Urinary tract infection, site not specified: Secondary | ICD-10-CM

## 2022-04-01 DIAGNOSIS — F32A Depression, unspecified: Secondary | ICD-10-CM | POA: Diagnosis present

## 2022-04-01 DIAGNOSIS — Z8249 Family history of ischemic heart disease and other diseases of the circulatory system: Secondary | ICD-10-CM

## 2022-04-01 DIAGNOSIS — R1110 Vomiting, unspecified: Secondary | ICD-10-CM

## 2022-04-01 DIAGNOSIS — N3010 Interstitial cystitis (chronic) without hematuria: Secondary | ICD-10-CM | POA: Diagnosis present

## 2022-04-01 DIAGNOSIS — I341 Nonrheumatic mitral (valve) prolapse: Secondary | ICD-10-CM

## 2022-04-01 DIAGNOSIS — K529 Noninfective gastroenteritis and colitis, unspecified: Principal | ICD-10-CM | POA: Diagnosis present

## 2022-04-01 DIAGNOSIS — I10 Essential (primary) hypertension: Secondary | ICD-10-CM | POA: Diagnosis present

## 2022-04-01 DIAGNOSIS — K625 Hemorrhage of anus and rectum: Secondary | ICD-10-CM | POA: Diagnosis present

## 2022-04-01 DIAGNOSIS — K449 Diaphragmatic hernia without obstruction or gangrene: Secondary | ICD-10-CM | POA: Diagnosis present

## 2022-04-01 DIAGNOSIS — Z79899 Other long term (current) drug therapy: Secondary | ICD-10-CM

## 2022-04-01 DIAGNOSIS — G43909 Migraine, unspecified, not intractable, without status migrainosus: Secondary | ICD-10-CM | POA: Diagnosis present

## 2022-04-01 HISTORY — DX: Other specified depressive episodes: F32.89

## 2022-04-01 HISTORY — DX: Essential (primary) hypertension: I10

## 2022-04-01 HISTORY — DX: Other intestinal obstruction unspecified as to partial versus complete obstruction: K56.699

## 2022-04-01 HISTORY — DX: Nonrheumatic mitral (valve) prolapse: I34.1

## 2022-04-01 HISTORY — DX: Migraine, unspecified, not intractable, without status migrainosus: G43.909

## 2022-04-01 LAB — URINALYSIS, MACROSCOPIC
BILIRUBIN: NEGATIVE mg/dL
BLOOD: NEGATIVE mg/dL
GLUCOSE: NEGATIVE mg/dL
KETONES: 80 mg/dL — AB
LEUKOCYTES: 250 WBCs/uL — AB
NITRITE: NEGATIVE
PH: 7.5 (ref 5.0–9.0)
PROTEIN: 30 mg/dL — AB
SPECIFIC GRAVITY: 1.022 (ref 1.002–1.030)
UROBILINOGEN: NORMAL mg/dL

## 2022-04-01 LAB — CBC WITH DIFF
BASOPHIL #: 0 10*3/uL (ref 0.00–0.30)
BASOPHIL %: 1 % (ref 0–3)
EOSINOPHIL #: 0 10*3/uL (ref 0.00–0.80)
EOSINOPHIL %: 0 % (ref 0–7)
HCT: 43 % (ref 37.0–47.0)
HGB: 14.8 g/dL (ref 12.5–16.0)
LYMPHOCYTE #: 1.3 10*3/uL (ref 1.10–5.00)
LYMPHOCYTE %: 16 % — ABNORMAL LOW (ref 25–45)
MCH: 32.2 pg — ABNORMAL HIGH (ref 27.0–32.0)
MCHC: 34.5 g/dL (ref 32.0–36.0)
MCV: 93.3 fL (ref 78.0–99.0)
MONOCYTE #: 0.4 10*3/uL (ref 0.00–1.30)
MONOCYTE %: 5 % (ref 0–12)
MPV: 9.4 fL (ref 7.4–10.4)
NEUTROPHIL #: 6.6 10*3/uL (ref 1.80–8.40)
NEUTROPHIL %: 79 % — ABNORMAL HIGH (ref 40–76)
PLATELETS: 229 10*3/uL (ref 140–440)
RBC: 4.61 10*6/uL (ref 4.20–5.40)
RDW: 12.7 % (ref 11.6–14.8)
WBC: 8.3 10*3/uL (ref 4.0–10.5)
WBCS UNCORRECTED: 8.3 10*3/uL

## 2022-04-01 LAB — COMPREHENSIVE METABOLIC PANEL, NON-FASTING
ALBUMIN/GLOBULIN RATIO: 1.6 — ABNORMAL HIGH (ref 0.8–1.4)
ALBUMIN: 4.5 g/dL (ref 3.5–5.7)
ALKALINE PHOSPHATASE: 57 U/L (ref 34–104)
ALT (SGPT): 42 U/L (ref 7–52)
ANION GAP: 9 mmol/L — ABNORMAL LOW (ref 10–20)
AST (SGOT): 34 U/L (ref 13–39)
BILIRUBIN TOTAL: 0.6 mg/dL (ref 0.3–1.2)
BUN/CREA RATIO: 19 (ref 6–22)
BUN: 15 mg/dL (ref 7–25)
CALCIUM, CORRECTED: 8.9 mg/dL (ref 8.9–10.8)
CALCIUM: 9.4 mg/dL (ref 8.6–10.3)
CHLORIDE: 106 mmol/L (ref 98–107)
CO2 TOTAL: 26 mmol/L (ref 21–31)
CREATININE: 0.79 mg/dL (ref 0.60–1.30)
ESTIMATED GFR: 86 mL/min/{1.73_m2} (ref >59)
GLOBULIN: 2.8 — ABNORMAL LOW (ref 2.9–5.4)
GLUCOSE: 92 mg/dL (ref 74–109)
OSMOLALITY, CALCULATED: 282 mOsm/kg (ref 270–290)
POTASSIUM: 3.8 mmol/L (ref 3.5–5.1)
PROTEIN TOTAL: 7.3 g/dL (ref 6.4–8.9)
SODIUM: 141 mmol/L (ref 136–145)

## 2022-04-01 LAB — URINALYSIS, MICROSCOPIC
BACTERIA: NEGATIVE /hpf
HYALINE CASTS: 5 /lpf — ABNORMAL HIGH (ref <0)
SQUAMOUS EPITHELIAL: 24 /hpf (ref <28)
WBCS: 6 /hpf — ABNORMAL HIGH (ref <6)

## 2022-04-01 LAB — PT/INR
INR: 1.07 (ref <=5.00)
PROTHROMBIN TIME: 12.4 seconds (ref 9.8–12.7)

## 2022-04-01 LAB — OCCULT BLOOD (PRN ED USE ONLY): OCCULT BLOOD: NEGATIVE

## 2022-04-01 LAB — GRAY TOP TUBE

## 2022-04-01 LAB — PTT (PARTIAL THROMBOPLASTIN TIME): APTT: 24.3 seconds — ABNORMAL LOW (ref 26.0–36.0)

## 2022-04-01 LAB — GOLD TOP TUBE

## 2022-04-01 LAB — LIGHT GREEN TOP TUBE

## 2022-04-01 LAB — LAVENDER TOP TUBE

## 2022-04-01 MED ORDER — PROMETHAZINE 25 MG TABLET
ORAL_TABLET | ORAL | Status: AC
Start: 2022-04-01 — End: 2022-04-01
  Filled 2022-04-01: qty 1

## 2022-04-01 MED ORDER — ONDANSETRON HCL (PF) 4 MG/2 ML INJECTION SOLUTION
INTRAMUSCULAR | Status: AC
Start: 2022-04-01 — End: 2022-04-01
  Filled 2022-04-01: qty 2

## 2022-04-01 MED ORDER — IOPAMIDOL 370 MG IODINE/ML (76 %) INTRAVENOUS SOLUTION
75.0000 mL | INTRAVENOUS | Status: AC
Start: 2022-04-01 — End: 2022-04-01
  Administered 2022-04-01: 75 mL via INTRAVENOUS

## 2022-04-01 MED ORDER — SODIUM CHLORIDE 0.9 % IV BOLUS
1000.0000 mL | INJECTION | Status: AC
Start: 2022-04-01 — End: 2022-04-01
  Administered 2022-04-01: 0 mL via INTRAVENOUS
  Administered 2022-04-01: 1000 mL via INTRAVENOUS

## 2022-04-01 MED ORDER — SODIUM CHLORIDE 0.45 % INTRAVENOUS SOLUTION
INTRAVENOUS | Status: DC
Start: 2022-04-01 — End: 2022-04-02
  Administered 2022-04-02: 0 mL via INTRAVENOUS

## 2022-04-01 MED ORDER — SODIUM CHLORIDE 0.9 % INTRAVENOUS PIGGYBACK
INJECTION | INTRAVENOUS | Status: AC
Start: 2022-04-01 — End: 2022-04-01
  Filled 2022-04-01: qty 100

## 2022-04-01 MED ORDER — ONDANSETRON HCL (PF) 4 MG/2 ML INJECTION SOLUTION
4.0000 mg | Freq: Three times a day (TID) | INTRAMUSCULAR | Status: DC | PRN
Start: 2022-04-01 — End: 2022-04-04
  Administered 2022-04-01 – 2022-04-04 (×3): 4 mg via INTRAVENOUS
  Filled 2022-04-01: qty 2

## 2022-04-01 MED ORDER — PROPRANOLOL 10 MG TABLET
40.0000 mg | ORAL_TABLET | Freq: Every evening | ORAL | Status: DC
Start: 2022-04-01 — End: 2022-04-04
  Administered 2022-04-01: 0 mg via ORAL
  Administered 2022-04-02 – 2022-04-03 (×2): 40 mg via ORAL

## 2022-04-01 MED ORDER — SODIUM CHLORIDE 0.9 % INTRAVENOUS PIGGYBACK
3.3750 g | Freq: Three times a day (TID) | INTRAVENOUS | Status: DC
Start: 2022-04-01 — End: 2022-04-04
  Administered 2022-04-01: 3.375 g via INTRAVENOUS
  Administered 2022-04-01 – 2022-04-02 (×2): 0 g via INTRAVENOUS
  Administered 2022-04-02 (×2): 3.375 g via INTRAVENOUS
  Administered 2022-04-02 (×2): 0 g via INTRAVENOUS
  Administered 2022-04-02 – 2022-04-03 (×3): 3.375 g via INTRAVENOUS
  Administered 2022-04-03 (×2): 0 g via INTRAVENOUS
  Administered 2022-04-03: 3.375 g via INTRAVENOUS
  Administered 2022-04-03: 0 g via INTRAVENOUS
  Administered 2022-04-04 (×2): 3.375 g via INTRAVENOUS
  Administered 2022-04-04 (×2): 0 g via INTRAVENOUS
  Filled 2022-04-01 (×3): qty 15

## 2022-04-01 MED ORDER — PIPERACILLIN-TAZOBACTAM 3.375 GRAM INTRAVENOUS SOLUTION
INTRAVENOUS | Status: AC
Start: 2022-04-01 — End: 2022-04-01
  Filled 2022-04-01: qty 15

## 2022-04-01 MED ORDER — PANTOPRAZOLE 40 MG INTRAVENOUS SOLUTION
INTRAVENOUS | Status: AC
Start: 2022-04-01 — End: 2022-04-01
  Filled 2022-04-01: qty 10

## 2022-04-01 MED ORDER — PANTOPRAZOLE 40 MG INTRAVENOUS SOLUTION
40.0000 mg | Freq: Two times a day (BID) | INTRAVENOUS | Status: DC
Start: 2022-04-01 — End: 2022-04-04
  Administered 2022-04-01 – 2022-04-04 (×6): 40 mg via INTRAVENOUS
  Filled 2022-04-01: qty 10

## 2022-04-01 MED ORDER — MORPHINE 4 MG/ML INJECTION WRAPPER
4.0000 mg | INJECTION | INTRAMUSCULAR | Status: DC | PRN
Start: 2022-04-01 — End: 2022-04-04

## 2022-04-01 MED ORDER — PROMETHAZINE 25 MG TABLET
25.0000 mg | ORAL_TABLET | ORAL | Status: AC
Start: 2022-04-01 — End: 2022-04-01
  Administered 2022-04-01: 25 mg via ORAL

## 2022-04-01 MED ORDER — SODIUM CHLORIDE 0.9 % INTRAVENOUS PIGGYBACK
3.3750 g | INTRAVENOUS | Status: DC
Start: 2022-04-01 — End: 2022-04-01

## 2022-04-01 MED ORDER — ONDANSETRON HCL (PF) 4 MG/2 ML INJECTION SOLUTION
4.0000 mg | INTRAMUSCULAR | Status: AC
Start: 2022-04-01 — End: 2022-04-01
  Administered 2022-04-01: 4 mg via INTRAVENOUS

## 2022-04-01 MED ORDER — HYDROCHLOROTHIAZIDE 25 MG TABLET
25.0000 mg | ORAL_TABLET | Freq: Every day | ORAL | Status: DC
Start: 2022-04-02 — End: 2022-04-04
  Administered 2022-04-02 – 2022-04-04 (×3): 25 mg via ORAL
  Filled 2022-04-01: qty 1

## 2022-04-01 MED ORDER — ALUMINUM-MAG HYDROXIDE-SIMETHICONE 200 MG-200 MG-20 MG/5 ML ORAL SUSP
10.0000 mL | ORAL | Status: DC | PRN
Start: 2022-04-01 — End: 2022-04-04

## 2022-04-01 NOTE — ED Triage Notes (Signed)
N/V passing bloody stool that she reports is mostly blood. Symptoms are persistent for "months" but worsened over past week.

## 2022-04-01 NOTE — H&P (Signed)
[place] [facility]    HOSPITALIST H&P    Yesenia Walker 61 y.o. female ED27/ED27   Date of Service: 04/01/2022    Date of Admission:  04/01/2022   PCP: Mariah MillingJana Peters, DO Code Status:No Order       Chief Complaint:  Abdominal pain, vomiting, and rectal bleeding    HPI:   [age]yr old female presents to the ED with intermittent lower abdominal pain for 6 months, vomiting for the last few days and rectal bleeding for 3 days.  Patient states that she had a loose stool Wednesday evening and noted bright red blood.  She states all throughout that night and the next day she felt the urge to defecate but there was no stool, only bright red blood.  She has had no dysuria but has had urinary frequency, chills without fever.  She denies flank pain.  She says that she will feel hot and then vomit. She says that the abdominal pain has been about every other week for that duration of time. She had an EGD in January of this year that showed hiatal hernia per patient.  She states some biopsies were taken but she did not follow up with results.  She has been on Carafate twice a day before meals.  She was on omeprazole and Pepcid and they doubled her medication after EGD, which she now takes Pepcid 40 at bedtime and she was taken omeprazole 40 mg daily.  She says her PCP recently took her off omeprazole and put her on Protonix, she takes 40mg  daily.  Patient has history of hypertension, interstitial cystitis, and migraines.  She does take metoprolol for an elevated heart rate.  She describes the lower abdominal pain when it hits as sharp and intermittent. Her last Pap smear was 2021.  She is on estradiol cream that she takes twice a week.  Patient's hemoglobin was stable at 14.8 and hematocrit was 43.  Patient's urine was turbid with ketones 80, protein 30, leuko esterase 250 and WBC 6.  They did a CT of the abdomen/pelvis that showed some questionable mild wall thickening to the distal descending colon and  proximal sigmoid colon concerning for mild colitis.  Patient states this is the 3rd time she has had come to the ER in the last 6 months with abdominal pain.  Patient states on Wednesday night when she was having the rectal bleeding she felt like she might pass out.  She has no prior history of rectal bleeding and is not on any anticoagulation medications or aspirin.  Patient will be admitted to the hospitalist services.            ED medications:   Medications Administered in the ED   NS bolus infusion 1,000 mL (0 mL Intravenous Stopped 04/01/22 1413)   ondansetron (ZOFRAN) 2 mg/mL injection (4 mg Intravenous Given 04/01/22 1300)   promethazine (PHENERGAN) tablet (has no administration in time range)   iopamidol (ISOVUE-370) 76% infusion (75 mL Intravenous Given 04/01/22 1442)         PMHx:    Past Medical History:   Diagnosis Date   . Enterostenosis (CMS HCC)    . HTN (hypertension)    . Mitral valve prolapse         PSHx:   Past Surgical History:   Procedure Laterality Date   . BREAST MASS EXCISION     . HX TUBAL LIGATION     . INCONTINENCE SURGERY     .  SHOULDER SURGERY            Allergies:    Allergies   Allergen Reactions   . Ultram [Tramadol] Itching    Social History  Social History     Tobacco Use   . Smoking status: Never   Substance Use Topics   . Drug use: Never       Family History  Father-HTN  Mother-HTN, DM  Sister-melanoma  Brother-DM         Home Meds:      Prior to Admission medications    Medication Sig Start Date End Date Taking? Authorizing Provider   hydrochlorothiazide (HYDRODIURIL) 25 mg Oral Tablet Take 25 mg by mouth Once a day    Provider, Historical   nitrofurantoin macrocrystal (MACRODANTIN) 100 mg Oral Capsule Take 100 mg by mouth every night    Provider, Historical   propranolol (INDERAL) 40 mg Oral Tablet Take 40 mg by mouth Once    Provider, Historical          ROS:   All ten point review of systems was negative except what is in the HPI.      Physical:  Filed Vitals:    04/01/22 0935    BP: 137/77   Pulse: (!) 111   Resp: 18   Temp: 36.7 C (98.1 F)   SpO2: 100%        General: Patient is alert and oriented to person, place, and time.   Head: Normocephalic and atraumatic.    Eyes: Pupils equally round and react to light.  Extraocular movements intact.  Conjunctiva normal. Sclerae are normal.    Nose: Nasal passages clear. Mucosa moist.    Throat: Moist oral mucosa. No erythema or exudate of the pharynx. Clear oropharynx.    Neck: Supple. Negative for lymphadenopathy, Carotids without bruit. Trachea midline   Heart: Regular rate and rhythm. S1 & S2 present. No rubs, gallops, or murmurs appreciated.     Lungs: Clear to auscultation bilaterally, no wheezes, rales or rhonchi. Equal chest excursion. No conversational dyspnea. No respiratory distress noted.   Abdomen: Soft, tender to umbilical, rt and left lower quads, and suprapubic,  nondistended belly. Bowel sounds are present in all four quadrants. No rigidity.  No guarding.  No ascites.   Extremities: No edema, cyanosis, or clubbing. Grossly moves all extremities. Dorsalis pedis pulses +2/4 bilaterally.  Brisk capillary refill.  Skin: Warm and dry without lesions. No ecchymosis noted.    Neurologic: Cranial nerves II through XII are grossly intact. Strength 5/5 in upper extremities and lower extremities bilaterally. Limited exam.  Genitourinary:  No Foley catheter   Psychiatric: Judgment intact. Mood and affect are appropriate for the situation.       Diagnostic studies:  CT abdomen/pelvis-see report      EKG interpretation: none        Assessments:  Active Hospital Problems   (*Primary Problem)    Diagnosis   . *Colitis   . UTI (urinary tract infection)   . Bright red rectal bleeding       Plan:  Patient will be placed in observation for the above problems. Dr. Concha Se consulted. Clear liquids. SCDs for DVT prophylaxis. Pulse ox.  OOB with assist. Labs in am. Nurse to call pharmacy to reconcile her home meds and then will be reconciled. Zosyn  for UTI. Pain medication and anti-emetics. IV fluids. Protonix IV for rectal bleed. Interventions based on clinical course.       Code status:  No Order  DVT prophylaxis: SCD    Diet: DIET CLEAR LIQUID Do you want to initiate MNT Protocol? Yes    Disposition:  home    Judene Companion, FNP-BC    [facility] MEDICINE HOSPITALIST    Patient seen and examined by me. Agree with mid level provider note.      This note was partially generated using MModal Fluency Direct system, and there may be some incorrect words, spellings, and punctuation that were not noted in checking the note before saving.

## 2022-04-01 NOTE — ED Provider Notes (Signed)
Emergency Medicine    Name: Yesenia Walker  Age and Gender: 61 y.o. female  Date of Birth: 1961-09-07  MRN: [id number]  PCP: Mariah Milling, DO    CC:  Chief Complaint   Patient presents with   . Blood In  Stool       HPI:  Yesenia Walker is a 61 y.o. White female with history of nausea, abdominal pain and rectal bleeding. She reports episodes of abdominal pain, nausea and emesis. This is her third episode in about six months.  This episode has been present for almost one week. Several days ago she also started having rectal bleeding.  She denies fever but has had an episode of near syncope.    She denies chest pain or dyspnea. She saw her PCP earlier this week and her stool was heme positive. She was started on a steroid suppository.     Her last colonoscopy was in 2021.    Bessie Pain Rating Scale     On a scale of 0-10, during the past 24 hours, pain has interfered with you usual activity:       On a scale of 0-10, during the past 24 hours, pain has interfered with your sleep:      On a scale of 0-10, during the past 24 hours, pain has affected your mood:       On a scale of 0-10, during the past 24 hours, pain has contributed to your stress:       On a scale of 0-10, what is your overall pain Rating:          Below pertinent information reviewed with patient:  Past Medical History:   Diagnosis Date   . Enterostenosis (CMS HCC)    . HTN (hypertension)    . Mitral valve prolapse            Allergies   Allergen Reactions   . Ultram [Tramadol] Itching       Past Surgical History:   Procedure Laterality Date   . BREAST MASS EXCISION     . HX TUBAL LIGATION     . INCONTINENCE SURGERY     . SHOULDER SURGERY             Social History        Objective:    ED Triage Vitals [04/01/22 0935]   BP (Non-Invasive) 137/77   Heart Rate (!) 111   Respiratory Rate 18   Temperature 36.7 C (98.1 F)   SpO2 100 %   Weight 85.3 kg (188 lb)   Height 1.6 m (5\' 3" )     Filed Vitals:    04/01/22 0935   BP: 137/77   Pulse: (!) 111   Resp:  18   Temp: 36.7 C (98.1 F)   SpO2: 100%       Nursing notes and vital signs reviewed.    Constitutional - No acute distress.  Alert and Active.  HEENT - Normocephalic. Atraumatic. PERRL. EOMI. Conjunctiva clear. Oropharynx with no erythema, lesions, or exudates. Moist mucous membranes.   Neck - Trachea midline. No stridor. No hoarseness.  Cardiac - Tachycardia, regular. No murmurs, rubs, or gallops.  Respiratory - Clear to auscultation bilaterally. No rales, wheezes or rhonchi.  Abdomen - Soft, tender left mid abdomen and left lower quadrant. No guarding or rigidity.  Musculoskeletal - Good AROM. No muscle or joint tenderness appreciated. No clubbing, cyanosis or edema.  Skin - Warm and dry, without any  rashes or other lesions.  Neuro - Cranial nerves II-XII are grossly intact.  Moving all extremities symmetrically.    Any pertinent labs and imaging obtained during this encounter reviewed below in MDM.    MDM/ED Course:    Medical Decision Making  Amount and/or Complexity of Data Reviewed  Labs: ordered. Decision-making details documented in ED Course.  Radiology: ordered. Decision-making details documented in ED Course.      Risk  Decision regarding hospitalization.        Patient has colitis and is unable to tolerate po meds. She will need to be admitted to the hospitalist service. Judene Companionathy Hamblin, NP, consulted.       Orders Placed This Encounter   . OCCULT BLOOD (PRN ED USE ONLY)   . URINE CULTURE,ROUTINE   . CT ABDOMEN PELVIS W IV CONTRAST   . CBC/DIFF   . COMPREHENSIVE METABOLIC PANEL, NON-FASTING   . PTT (PARTIAL THROMBOPLASTIN TIME)   . PT/INR   . URINALYSIS, MACROSCOPIC AND MICROSCOPIC W/CULTURE REFLEX [PRN ONLY]   . CBC WITH DIFF   . URINALYSIS, MACROSCOPIC   . URINALYSIS, MICROSCOPIC   . EXTRA TUBES   . GOLD TOP TUBE   . LIGHT GREEN TOP TUBE   . LAVENDER TOP TUBE   . GRAY TOP TUBE   . NS bolus infusion 1,000 mL   . ondansetron (ZOFRAN) 2 mg/mL injection   . promethazine (PHENERGAN) tablet   . iopamidol  (ISOVUE-370) 76% infusion   . piperacillin-tazobactam (ZOSYN) 3.375 g in NS 100 mL IVPB minibag       There was no indication for administration of aspirin in this patient today.    Any procedures:  Procedures    Impression:   Clinical Impression   Colitis (Primary)   Vomiting, unspecified vomiting type, unspecified whether nausea present       Disposition: Admitted          Portions of this note may have been dictated using voice recognition software.     Cain SaupeEdwin Presten Joost, MD  [facility]    -----------------------  Results for orders placed or performed during the hospital encounter of 04/01/22 (from the past 12 hour(s))   COMPREHENSIVE METABOLIC PANEL, NON-FASTING   Result Value Ref Range    SODIUM 141 136 - 145 mmol/L    POTASSIUM 3.8 3.5 - 5.1 mmol/L    CHLORIDE 106 98 - 107 mmol/L    CO2 TOTAL 26 21 - 31 mmol/L    ANION GAP 9 (L) 10 - 20 mmol/L    BUN 15 7 - 25 mg/dL    CREATININE 3.870.79 5.640.60 - 1.30 mg/dL    BUN/CREA RATIO 19 6 - 22    ESTIMATED GFR 86 >59 mL/min/1.3943m^2    ALBUMIN 4.5 3.5 - 5.7 g/dL    CALCIUM 9.4 8.6 - 33.210.3 mg/dL    GLUCOSE 92 74 - 951109 mg/dL    ALKALINE PHOSPHATASE 57 34 - 104 U/L    ALT (SGPT) 42 7 - 52 U/L    AST (SGOT) 34 13 - 39 U/L    BILIRUBIN TOTAL 0.6 0.3 - 1.2 mg/dL    PROTEIN TOTAL 7.3 6.4 - 8.9 g/dL    ALBUMIN/GLOBULIN RATIO 1.6 (H) 0.8 - 1.4    OSMOLALITY, CALCULATED 282 270 - 290 mOsm/kg    CALCIUM, CORRECTED 8.9 8.9 - 10.8 mg/dL    GLOBULIN 2.8 (L) 2.9 - 5.4   PTT (PARTIAL THROMBOPLASTIN TIME)   Result Value Ref Range  APTT 24.3 (L) 26.0 - 36.0 seconds   PT/INR   Result Value Ref Range    PROTHROMBIN TIME 12.4 9.8 - 12.7 seconds    INR 1.07 <=5.00   CBC WITH DIFF   Result Value Ref Range    WBCS UNCORRECTED 8.3 x10^3/uL    WBC 8.3 4.0 - 10.5 x10^3/uL    RBC 4.61 4.20 - 5.40 x10^6/uL    HGB 14.8 12.5 - 16.0 g/dL    HCT 97.9 89.2 - 11.9 %    MCV 93.3 78.0 - 99.0 fL    MCH 32.2 (H) 27.0 - 32.0 pg    MCHC 34.5 32.0 - 36.0 g/dL    RDW 41.7 40.8 - 14.4 %    PLATELETS  229 140 - 440 x10^3/uL    MPV 9.4 7.4 - 10.4 fL    NEUTROPHIL % 79 (H) 40 - 76 %    LYMPHOCYTE % 16 (L) 25 - 45 %    MONOCYTE % 5 0 - 12 %    EOSINOPHIL % 0 0 - 7 %    BASOPHIL % 1 0 - 3 %    NEUTROPHIL # 6.60 1.80 - 8.40 x10^3/uL    LYMPHOCYTE # 1.30 1.10 - 5.00 x10^3/uL    MONOCYTE # 0.40 0.00 - 1.30 x10^3/uL    EOSINOPHIL # 0.00 0.00 - 0.80 x10^3/uL    BASOPHIL # 0.00 0.00 - 0.30 x10^3/uL   OCCULT BLOOD (PRN ED USE ONLY)    Specimen: Stool   Result Value Ref Range    OCCULT BLOOD Negative Negative   URINALYSIS, MACROSCOPIC   Result Value Ref Range    COLOR Yellow Colorless, Light Yellow, Yellow    APPEARANCE Turbid (A) Clear    SPECIFIC GRAVITY 1.022 1.002 - 1.030    PH 7.5 5.0 - 9.0    LEUKOCYTES 250 (A) Negative, 100  WBCs/uL    NITRITE Negative Negative    PROTEIN 30 (A) Negative, 10 , 20  mg/dL    GLUCOSE Negative Negative, 30  mg/dL    KETONES 80 (A) Negative, Trace mg/dL    BILIRUBIN Negative Negative, 0.5 mg/dL    BLOOD Negative Negative mg/dL    UROBILINOGEN Normal Normal mg/dL   URINALYSIS, MICROSCOPIC   Result Value Ref Range    BACTERIA Negative Occassional of less /hpf /hpf    MUCOUS Moderate (A) (none) /hpf    RBCS      WBCS 6 (H) <6 /hpf    HYALINE CASTS 5 (H) <0 /lpf    SQUAMOUS EPITHELIAL 24 <28 /hpf     CT ABDOMEN PELVIS W IV CONTRAST   Final Result   QUESTIONABLE MILD WALL THICKENING TO THE DISTAL DESCENDING AND PROXIMAL SIGMOID COLON. A MILD COLITIS IS NOT EXCLUDED.          One or more dose reduction techniques were used (e.g., Automated exposure control, adjustment of the mA and/or kV according to patient size, use of iterative reconstruction technique).         Radiologist location ID: [id number]

## 2022-04-01 NOTE — ED Nurses Note (Signed)
PT ASSIGNED TO ER MINOR ROOM 27. PT HERE TODAY WITH C/O OF VOMITING WITH BLOODY STOOLS THIS PAST WEEK. AWAITING PROVIDER'S ASSESSMENT AND ORDERS. WILL CONTINUE TO MONITOR.

## 2022-04-02 DIAGNOSIS — I10 Essential (primary) hypertension: Secondary | ICD-10-CM | POA: Diagnosis present

## 2022-04-02 LAB — BASIC METABOLIC PANEL
ANION GAP: 6 mmol/L — ABNORMAL LOW (ref 10–20)
BUN/CREA RATIO: 16 (ref 6–22)
BUN: 12 mg/dL (ref 7–25)
CALCIUM: 8.4 mg/dL — ABNORMAL LOW (ref 8.6–10.3)
CHLORIDE: 107 mmol/L (ref 98–107)
CO2 TOTAL: 27 mmol/L (ref 21–31)
CREATININE: 0.75 mg/dL (ref 0.60–1.30)
ESTIMATED GFR: 91 mL/min/{1.73_m2} (ref >59)
GLUCOSE: 81 mg/dL (ref 74–109)
OSMOLALITY, CALCULATED: 278 mOsm/kg (ref 270–290)
POTASSIUM: 3.8 mmol/L (ref 3.5–5.1)
SODIUM: 140 mmol/L (ref 136–145)

## 2022-04-02 LAB — CBC
HCT: 39 % (ref 37.0–47.0)
HGB: 13.6 g/dL (ref 12.5–16.0)
MCH: 32.6 pg — ABNORMAL HIGH (ref 27.0–32.0)
MCHC: 34.9 g/dL (ref 32.0–36.0)
MCV: 93.4 fL (ref 78.0–99.0)
MPV: 8.9 fL (ref 7.4–10.4)
PLATELETS: 192 10*3/uL (ref 140–440)
RBC: 4.18 10*6/uL — ABNORMAL LOW (ref 4.20–5.40)
RDW: 12.2 % (ref 11.6–14.8)
WBC: 6.1 10*3/uL (ref 4.0–10.5)
WBCS UNCORRECTED: 6.1 10*3/uL

## 2022-04-02 MED ORDER — HYDROCHLOROTHIAZIDE 25 MG TABLET
ORAL_TABLET | ORAL | Status: AC
Start: 2022-04-02 — End: 2022-04-02
  Filled 2022-04-02: qty 1

## 2022-04-02 MED ORDER — SODIUM CHLORIDE 0.9 % INTRAVENOUS PIGGYBACK
INJECTION | INTRAVENOUS | Status: AC
Start: 2022-04-02 — End: 2022-04-02
  Filled 2022-04-02: qty 100

## 2022-04-02 MED ORDER — FAMOTIDINE 20 MG TABLET
ORAL_TABLET | ORAL | Status: AC
Start: 2022-04-02 — End: 2022-04-02
  Filled 2022-04-02: qty 2

## 2022-04-02 MED ORDER — PIPERACILLIN-TAZOBACTAM 3.375 GRAM INTRAVENOUS SOLUTION
INTRAVENOUS | Status: AC
Start: 2022-04-02 — End: 2022-04-02
  Filled 2022-04-02: qty 15

## 2022-04-02 MED ORDER — SUCRALFATE 1 GRAM TABLET
ORAL_TABLET | ORAL | Status: AC
Start: 2022-04-02 — End: 2022-04-02
  Filled 2022-04-02: qty 1

## 2022-04-02 MED ORDER — METOPROLOL SUCCINATE ER 25 MG TABLET,EXTENDED RELEASE 24 HR
25.0000 mg | ORAL_TABLET | Freq: Every day | ORAL | Status: DC
Start: 2022-04-02 — End: 2022-04-04
  Administered 2022-04-02 – 2022-04-04 (×3): 25 mg via ORAL
  Filled 2022-04-02: qty 1

## 2022-04-02 MED ORDER — VENLAFAXINE ER 75 MG CAPSULE,EXTENDED RELEASE 24 HR
75.0000 mg | ORAL_CAPSULE | Freq: Every day | ORAL | Status: DC
Start: 2022-04-02 — End: 2022-04-04
  Administered 2022-04-02: 75 mg via ORAL
  Administered 2022-04-03: 0 mg via ORAL
  Administered 2022-04-04: 75 mg via ORAL
  Filled 2022-04-02 (×6): qty 1

## 2022-04-02 MED ORDER — HYDROCORTISONE ACETATE 25 MG RECTAL SUPPOSITORY
25.0000 mg | Freq: Two times a day (BID) | RECTAL | Status: DC | PRN
Start: 2022-04-02 — End: 2022-04-04
  Filled 2022-04-02: qty 1

## 2022-04-02 MED ORDER — LOSARTAN 50 MG TABLET
ORAL_TABLET | ORAL | Status: AC
Start: 2022-04-02 — End: 2022-04-02
  Filled 2022-04-02: qty 1

## 2022-04-02 MED ORDER — ACETAMINOPHEN 325 MG TABLET
650.0000 mg | ORAL_TABLET | ORAL | Status: DC | PRN
Start: 2022-04-02 — End: 2022-04-04
  Administered 2022-04-02 – 2022-04-03 (×3): 650 mg via ORAL

## 2022-04-02 MED ORDER — LOSARTAN 50 MG TABLET
50.0000 mg | ORAL_TABLET | Freq: Every day | ORAL | Status: DC
Start: 2022-04-02 — End: 2022-04-04
  Administered 2022-04-02 – 2022-04-04 (×3): 50 mg via ORAL
  Filled 2022-04-02: qty 1

## 2022-04-02 MED ORDER — ACETAMINOPHEN 325 MG TABLET
ORAL_TABLET | ORAL | Status: AC
Start: 2022-04-02 — End: 2022-04-02
  Filled 2022-04-02: qty 2

## 2022-04-02 MED ORDER — SODIUM CHLORIDE 0.45 % INTRAVENOUS SOLUTION
INTRAVENOUS | Status: DC
Start: 2022-04-02 — End: 2022-04-02

## 2022-04-02 MED ORDER — SODIUM CHLORIDE 0.45 % INTRAVENOUS SOLUTION
INTRAVENOUS | Status: DC
Start: 2022-04-02 — End: 2022-04-03
  Administered 2022-04-03: 0 mL via INTRAVENOUS

## 2022-04-02 MED ORDER — PROPRANOLOL 10 MG TABLET
ORAL_TABLET | ORAL | Status: AC
Start: 2022-04-02 — End: 2022-04-02
  Filled 2022-04-02: qty 4

## 2022-04-02 MED ORDER — PANTOPRAZOLE 40 MG INTRAVENOUS SOLUTION
INTRAVENOUS | Status: AC
Start: 2022-04-02 — End: 2022-04-02
  Filled 2022-04-02: qty 10

## 2022-04-02 MED ORDER — PANTOPRAZOLE 40 MG TABLET,DELAYED RELEASE
40.0000 mg | DELAYED_RELEASE_TABLET | Freq: Two times a day (BID) | ORAL | Status: DC
Start: 2022-04-02 — End: 2022-04-02

## 2022-04-02 MED ORDER — ACETAMINOPHEN 325 MG TABLET
ORAL_TABLET | ORAL | Status: AC
Start: 2022-04-02 — End: 2022-04-02
  Filled 2022-04-02: qty 1

## 2022-04-02 MED ORDER — ONDANSETRON HCL (PF) 4 MG/2 ML INJECTION SOLUTION
INTRAMUSCULAR | Status: AC
Start: 2022-04-02 — End: 2022-04-02
  Filled 2022-04-02: qty 2

## 2022-04-02 MED ORDER — METOPROLOL SUCCINATE ER 25 MG TABLET,EXTENDED RELEASE 24 HR
ORAL_TABLET | ORAL | Status: AC
Start: 2022-04-02 — End: 2022-04-02
  Filled 2022-04-02: qty 1

## 2022-04-02 MED ORDER — TOLTERODINE 2 MG TABLET
2.0000 mg | ORAL_TABLET | Freq: Two times a day (BID) | ORAL | Status: DC
Start: 2022-04-02 — End: 2022-04-04
  Administered 2022-04-02: 0 mg via ORAL
  Administered 2022-04-02 – 2022-04-03 (×2): 2 mg via ORAL
  Administered 2022-04-03: 0 mg via ORAL
  Administered 2022-04-04 (×2): 2 mg via ORAL
  Filled 2022-04-02 (×9): qty 1

## 2022-04-02 MED ORDER — FAMOTIDINE 20 MG TABLET
40.0000 mg | ORAL_TABLET | Freq: Two times a day (BID) | ORAL | Status: DC
Start: 2022-04-02 — End: 2022-04-04
  Administered 2022-04-02 – 2022-04-04 (×5): 40 mg via ORAL
  Filled 2022-04-02: qty 2

## 2022-04-02 MED ORDER — SUCRALFATE 1 GRAM TABLET
1.0000 g | ORAL_TABLET | Freq: Two times a day (BID) | ORAL | Status: DC
Start: 2022-04-02 — End: 2022-04-04
  Administered 2022-04-02 – 2022-04-04 (×6): 1 g via ORAL
  Filled 2022-04-02 (×2): qty 1

## 2022-04-02 MED ORDER — OXYCODONE-ACETAMINOPHEN 5 MG-325 MG TABLET
1.0000 | ORAL_TABLET | ORAL | Status: DC | PRN
Start: 2022-04-02 — End: 2022-04-04
  Administered 2022-04-03: 1 via ORAL

## 2022-04-02 NOTE — Consults (Signed)
[facility]  Gastroenterology/ Hepatology Consult Note    Yesenia Walker, Yesenia Walker, 61 y.o. female  Encounter Start Date:  04/01/2022  Inpatient Admission Date:    Date of Birth:  1961-07-31    Date of Service: 04/02/22     Referring physician:  No ref. provider found    Chief Complaint: abdominal pain, rectal bleeding, vomiting    Yesenia Walker is a 61 y.o., White female who presents with intermittent lower abdominal pain for 6 months, vomiting for the last few days with rectal bleeding for 3 days.  She states she has felt the urge to defecate but no stooling bright red blood.  She has denied any bowel movement since arrival to ER white blood count and hemoglobin within normal limits.  She does follow up with [place] in the office had a recent EGD in January, report unavailable.  She has failed a follow-up after procedure.  She has had colonoscopy in the past in 2021 .  CT of the abdomen/pelvis noted some questionable mild wall thickening to the distal descending colon and proximal sigmoid colon concerning for mild colitis.  She denies any Crohn's or ulcerative colitis history.      REVIEW OF SYSTEMS:    Review of Systems was completed with pertinent ROS as addressed in HPI.            Medications Prior to Admission     Prescriptions    famotidine (PEPCID) 40 mg Oral Tablet    Take 1 Tablet (40 mg total) by mouth Twice daily    hydrocortisone acetate (ANUSOL-HC) 25 mg Rectal Suppository    Insert 1 Suppository (25 mg total) into the rectum Twice per day as needed    losartan (COZAAR) 50 mg Oral Tablet    Take 1 Tablet (50 mg total) by mouth Once a day    metoprolol succinate (TOPROL-XL) 25 mg Oral Tablet Sustained Release 24 hr    Take 1 Tablet (25 mg total) by mouth Once a day    omeprazole (PRILOSEC) 40 mg Oral Capsule, Delayed Release(E.C.)    Take 1 Capsule (40 mg total) by mouth Twice daily    solifenacin (VESICARE) 10 mg Oral Tablet    Take 1 Tablet (10 mg  total) by mouth Once a day    sucralfate (CARAFATE) 1 gram Oral Tablet    Take 1 Tablet (1 g total) by mouth Twice a day before meals    venlafaxine (EFFEXOR XR) 75 mg Oral Capsule, Sust. Release 24 hr    Take 1 Capsule (75 mg total) by mouth Once a day           Allergies:    Allergies   Allergen Reactions   . Ultram [Tramadol] Itching         ED medications:   Medications Administered in the ED   NS bolus infusion 1,000 mL (0 mL Intravenous Stopped 04/01/22 1413)   ondansetron (ZOFRAN) 2 mg/mL injection (4 mg Intravenous Given 04/01/22 1300)   promethazine (PHENERGAN) tablet (has no administration in time range)   iopamidol (ISOVUE-370) 76% infusion (75 mL Intravenous Given 04/01/22 1442)         Home Meds:      Prior to Admission medications    Medication Sig Start Date End Date Taking? Authorizing Provider   famotidine (PEPCID) 40 mg Oral Tablet Take 1 Tablet (40 mg total) by mouth Twice daily   Yes Provider, Historical   hydrocortisone acetate (ANUSOL-HC) 25 mg Rectal Suppository  Insert 1 Suppository (25 mg total) into the rectum Twice per day as needed   Yes Provider, Historical   losartan (COZAAR) 50 mg Oral Tablet Take 1 Tablet (50 mg total) by mouth Once a day   Yes Provider, Historical   metoprolol succinate (TOPROL-XL) 25 mg Oral Tablet Sustained Release 24 hr Take 1 Tablet (25 mg total) by mouth Once a day   Yes Provider, Historical   omeprazole (PRILOSEC) 40 mg Oral Capsule, Delayed Release(E.C.) Take 1 Capsule (40 mg total) by mouth Twice daily   Yes Provider, Historical   solifenacin (VESICARE) 10 mg Oral Tablet Take 1 Tablet (10 mg total) by mouth Once a day   Yes Provider, Historical   sucralfate (CARAFATE) 1 gram Oral Tablet Take 1 Tablet (1 g total) by mouth Twice a day before meals   Yes Provider, Historical   venlafaxine (EFFEXOR XR) 75 mg Oral Capsule, Sust. Release 24 hr Take 1 Capsule (75 mg total) by mouth Once a day   Yes Provider, Historical   hydrochlorothiazide (HYDRODIURIL) 25 mg Oral  Tablet Take 1 Tablet (25 mg total) by mouth Once a day  04/01/22  Provider, Historical   nitrofurantoin macrocrystal (MACRODANTIN) 100 mg Oral Capsule Take 100 mg by mouth every night  04/01/22  Provider, Historical   propranolol (INDERAL) 40 mg Oral Tablet Take 40 mg by mouth Once  04/01/22  Provider, Historical          Inpatient Meds:      1/2 NS premix infusion, , Intravenous, Continuous  aluminum-magnesium hydroxide-simethicone (MAG-AL PLUS) 200-200-20 mg per 5 mL oral liquid, 10 mL, Oral, Q4H PRN  famotidine (PEPCID) tablet, 40 mg, Oral, 2x/day  hydroCHLOROthiazide (HYDRODIURIL) tablet, 25 mg, Oral, Daily  hydrocortisone (ANUSOL-HC) rectal suppository, 25 mg, Rectal, 2x/day PRN  losartan (COZAAR) tablet, 50 mg, Oral, Daily  metoprolol succinate (TOPROL-XL) 24 hr extended release tablet, 25 mg, Oral, Daily  morphine 4 mg/mL injection, 4 mg, Intravenous, Q4H PRN  ondansetron (ZOFRAN) 2 mg/mL injection, 4 mg, Intravenous, Q8H PRN  oxyCODONE-acetaminophen (PERCOCET) 5-325mg  per tablet, 1 Tablet, Oral, Q4H PRN  pantoprazole (PROTONIX) injection, 40 mg, Intravenous, Q12H  piperacillin-tazobactam (ZOSYN) 3.375 g in NS 100 mL IVPB minibag, 3.375 g, Intravenous, Q8H  propranolol (INDERAL) tablet, 40 mg, Oral, NIGHTLY  sucralfate (CARAFATE) tablet, 1 g, Oral, 2x/day AC  tolterodine (DETROL) tablet, 2 mg, Oral, Q12H  venlafaxine (EFFEXOR XR) 24 hr extended release capsule, 75 mg, Oral, Daily             PMHx:    Past Medical History:   Diagnosis Date   . Depression    . Enterostenosis (CMS HCC)    . HTN (hypertension)    . Migraines    . Mitral valve prolapse         PSHx:   Past Surgical History:   Procedure Laterality Date   . BREAST MASS EXCISION     . HX TUBAL LIGATION     . INCONTINENCE SURGERY     . SHOULDER SURGERY            Family History  Family Medical History:    None        Social History  Social History     Tobacco Use   . Smoking status: Never   Substance Use Topics   . Drug use: Never             Previous GI  Procedures:  * No surgery found *  Vital Signs:  No data recorded.      Systolic (24hrs), Avg:121 , Min:107 , Max:131     Diastolic (24hrs), Avg:71, Min:57, Max:85    MAP (Non-Invasive)  Avg: 85.8 mmHG  Min: 75 mmHG  Max: 98 mmHG  SpO2  Avg: 95.7 %  Min: 89 %  Max: 100 %           PHYSICAL EXAM:   Temperature: 36.7 C (98.1 F)  Heart Rate: (!) 111  BP (Non-Invasive): 131/85  Respiratory Rate: 18  SpO2: 95 %  Constitutional:  no distress  Respiratory:  Clear to auscultation bilaterally.   Cardiovascular:  regular rate and rhythm  Gastrointestinal:  Bowel sounds normal, tenderness     RELEVANT LABS:  Labs:    Results for orders placed or performed during the hospital encounter of 04/01/22 (from the past 24 hour(s))   COMPREHENSIVE METABOLIC PANEL, NON-FASTING   Result Value Ref Range    SODIUM 141 136 - 145 mmol/L    POTASSIUM 3.8 3.5 - 5.1 mmol/L    CHLORIDE 106 98 - 107 mmol/L    CO2 TOTAL 26 21 - 31 mmol/L    ANION GAP 9 (L) 10 - 20 mmol/L    BUN 15 7 - 25 mg/dL    CREATININE 5.400.79 9.810.60 - 1.30 mg/dL    BUN/CREA RATIO 19 6 - 22    ESTIMATED GFR 86 >59 mL/min/1.7255m^2    ALBUMIN 4.5 3.5 - 5.7 g/dL    CALCIUM 9.4 8.6 - 19.110.3 mg/dL    GLUCOSE 92 74 - 478109 mg/dL    ALKALINE PHOSPHATASE 57 34 - 104 U/L    ALT (SGPT) 42 7 - 52 U/L    AST (SGOT) 34 13 - 39 U/L    BILIRUBIN TOTAL 0.6 0.3 - 1.2 mg/dL    PROTEIN TOTAL 7.3 6.4 - 8.9 g/dL    ALBUMIN/GLOBULIN RATIO 1.6 (H) 0.8 - 1.4    OSMOLALITY, CALCULATED 282 270 - 290 mOsm/kg    CALCIUM, CORRECTED 8.9 8.9 - 10.8 mg/dL    GLOBULIN 2.8 (L) 2.9 - 5.4   PTT (PARTIAL THROMBOPLASTIN TIME)   Result Value Ref Range    APTT 24.3 (L) 26.0 - 36.0 seconds   PT/INR   Result Value Ref Range    PROTHROMBIN TIME 12.4 9.8 - 12.7 seconds    INR 1.07 <=5.00   CBC WITH DIFF   Result Value Ref Range    WBCS UNCORRECTED 8.3 x10^3/uL    WBC 8.3 4.0 - 10.5 x10^3/uL    RBC 4.61 4.20 - 5.40 x10^6/uL    HGB 14.8 12.5 - 16.0 g/dL    HCT 29.543.0 62.137.0 - 30.847.0 %    MCV 93.3 78.0 - 99.0 fL    MCH 32.2 (H)  27.0 - 32.0 pg    MCHC 34.5 32.0 - 36.0 g/dL    RDW 65.712.7 84.611.6 - 96.214.8 %    PLATELETS 229 140 - 440 x10^3/uL    MPV 9.4 7.4 - 10.4 fL    NEUTROPHIL % 79 (H) 40 - 76 %    LYMPHOCYTE % 16 (L) 25 - 45 %    MONOCYTE % 5 0 - 12 %    EOSINOPHIL % 0 0 - 7 %    BASOPHIL % 1 0 - 3 %    NEUTROPHIL # 6.60 1.80 - 8.40 x10^3/uL    LYMPHOCYTE # 1.30 1.10 - 5.00 x10^3/uL    MONOCYTE # 0.40 0.00 - 1.30 x10^3/uL  EOSINOPHIL # 0.00 0.00 - 0.80 x10^3/uL    BASOPHIL # 0.00 0.00 - 0.30 x10^3/uL   GOLD TOP TUBE   Result Value Ref Range    RAINBOW/EXTRA TUBE AUTO RESULT Yes    LIGHT GREEN TOP TUBE   Result Value Ref Range    RAINBOW/EXTRA TUBE AUTO RESULT Yes    LAVENDER TOP TUBE   Result Value Ref Range    RAINBOW/EXTRA TUBE AUTO RESULT Yes    GRAY TOP TUBE   Result Value Ref Range    RAINBOW/EXTRA TUBE AUTO RESULT Yes    OCCULT BLOOD (PRN ED USE ONLY)    Specimen: Stool   Result Value Ref Range    OCCULT BLOOD Negative Negative   URINALYSIS, MACROSCOPIC   Result Value Ref Range    COLOR Yellow Colorless, Light Yellow, Yellow    APPEARANCE Turbid (A) Clear    SPECIFIC GRAVITY 1.022 1.002 - 1.030    PH 7.5 5.0 - 9.0    LEUKOCYTES 250 (A) Negative, 100  WBCs/uL    NITRITE Negative Negative    PROTEIN 30 (A) Negative, 10 , 20  mg/dL    GLUCOSE Negative Negative, 30  mg/dL    KETONES 80 (A) Negative, Trace mg/dL    BILIRUBIN Negative Negative, 0.5 mg/dL    BLOOD Negative Negative mg/dL    UROBILINOGEN Normal Normal mg/dL   URINALYSIS, MICROSCOPIC   Result Value Ref Range    BACTERIA Negative Occassional of less /hpf /hpf    MUCOUS Moderate (A) (none) /hpf    RBCS      WBCS 6 (H) <6 /hpf    HYALINE CASTS 5 (H) <0 /lpf    SQUAMOUS EPITHELIAL 24 <28 /hpf   URINE CULTURE,ROUTINE    Specimen: Urine, Clean Catch   Result Value Ref Range    URINE CULTURE No Growth    CBC   Result Value Ref Range    WBCS UNCORRECTED 6.1 x10^3/uL    WBC 6.1 4.0 - 10.5 x10^3/uL    RBC 4.18 (L) 4.20 - 5.40 x10^6/uL    HGB 13.6 12.5 - 16.0 g/dL    HCT 16.1 09.6 - 04.5 %     MCV 93.4 78.0 - 99.0 fL    MCH 32.6 (H) 27.0 - 32.0 pg    MCHC 34.9 32.0 - 36.0 g/dL    RDW 40.9 81.1 - 91.4 %    PLATELETS 192 140 - 440 x10^3/uL    MPV 8.9 7.4 - 10.4 fL   BASIC METABOLIC PANEL - AM ONCE   Result Value Ref Range    SODIUM 140 136 - 145 mmol/L    POTASSIUM 3.8 3.5 - 5.1 mmol/L    CHLORIDE 107 98 - 107 mmol/L    CO2 TOTAL 27 21 - 31 mmol/L    ANION GAP 6 (L) 10 - 20 mmol/L    CALCIUM 8.4 (L) 8.6 - 10.3 mg/dL    GLUCOSE 81 74 - 782 mg/dL    BUN 12 7 - 25 mg/dL    CREATININE 9.56 2.13 - 1.30 mg/dL    BUN/CREA RATIO 16 6 - 22    ESTIMATED GFR 91 >59 mL/min/1.34m^2    OSMOLALITY, CALCULATED 278 270 - 290 mOsm/kg           CBC  Diff   Lab Results   Component Value Date/Time    WBC 6.1 04/02/2022 05:52 AM    HGB 13.6 04/02/2022 05:52 AM    HCT 39.0 04/02/2022 05:52 AM  PLTCNT 192 04/02/2022 05:52 AM    RBC 4.18 (L) 04/02/2022 05:52 AM    MCV 93.4 04/02/2022 05:52 AM    MCHC 34.9 04/02/2022 05:52 AM    MCH 32.6 (H) 04/02/2022 05:52 AM    RDW 12.2 04/02/2022 05:52 AM    MPV 8.9 04/02/2022 05:52 AM    Lab Results   Component Value Date/Time    PMNS 79 (H) 04/01/2022 01:17 PM    LYMPHOCYTES 16 (L) 04/01/2022 01:17 PM    EOSINOPHIL 0 04/01/2022 01:17 PM    MONOCYTES 5 04/01/2022 01:17 PM    BASOPHILS 1 04/01/2022 01:17 PM    BASOPHILS 0.00 04/01/2022 01:17 PM    PMNABS 6.60 04/01/2022 01:17 PM    LYMPHSABS 1.30 04/01/2022 01:17 PM    EOSABS 0.00 04/01/2022 01:17 PM    MONOSABS 0.40 04/01/2022 01:17 PM               Imaging Studies:    Recent Results (from the past [id number] hour(s))   CT ABDOMEN PELVIS W IV CONTRAST    Collection Time: 04/01/22  2:41 PM    Narrative    Yesenia Walker    RADIOLOGIST: Wynema Birch, MD    CT ABDOMEN PELVIS W IV CONTRAST performed on 04/01/2022 2:41 PM    CLINICAL HISTORY: abdominal pain, rectal bleeding.  Blood In Stool    TECHNIQUE:  Abdomen and pelvis CT with intravenous contrast.  IV CONTRAST: 75 ml's of Isovue 370    COMPARISON:  10/11/2021  # of known CTs in the  past 12 months: 2   # of known Cardiac Nuclear Medicine Studies in the past 12 months: 0    FINDINGS:  Lung bases: Mild dependent atelectasis    Liver:   There is a stable cyst in the left lobe measuring 4.2 cm. Fatty infiltration is present.    Gallbladder:   Surgically absent.    Spleen:   Unremarkable.    Pancreas:   Unremarkable.    Adrenals:   Unremarkable.    Kidneys:   There is a cyst in the midpole posteriorly of the right kidney measuring 15 mm.    Bladder:  Unremarkable.    Uterus and Adnexa:  Unremarkable. Tubal ligation clips are noted.    Bowel:   Colonic diverticulosis without diverticulitis.    The colon is decompressed and poorly evaluated. There is no adjacent inflammatory change. There is questionable mild wall thickening involving the distal descending and sigmoid colon versus prominence due to underdistention. A mild colitis is not excluded.    Appendix:  Normal.    Lymph nodes:  No suspicious lymph node enlargement.    Vasculature:   Major vascular structures are unremarkable.     Peritoneum / Retroperitoneum: No ascites.  No free air.    Bones:   Degenerative changes of the spine. There are also are degenerative changes of the sacroiliac joints.        Impression    QUESTIONABLE MILD WALL THICKENING TO THE DISTAL DESCENDING AND PROXIMAL SIGMOID COLON. A MILD COLITIS IS NOT EXCLUDED.       One or more dose reduction techniques were used (e.g., Automated exposure control, adjustment of the mA and/or kV according to patient size, use of iterative reconstruction technique).      Radiologist location ID: [id number]         Assessment/Plan and Recommendations:  1. Colitis  2. Abdominal pain   3. Vomiting   4.  Rectal bleeding  Plan:  Continue current management.  Continue antibiotics.  Will plan for outpatient colonoscopy in the near future.  She states she has an appointment with Cooper Render on June 3rd, advised to keep appointment.  Continue Protonix.  Continue clear liquids and advance as  tolerated.  Further recommendations depending upon clinical course.  Discussed with Dr. Concha Se in treatment by him.        Ulyess Blossom, NP-C

## 2022-04-02 NOTE — Progress Notes (Addendum)
Subjective: had some nausea. Drank a little bit.  Objective: looks weak. Husband at bedside.        ROS  Constitutional- No fever, night sweats, fatigue  EYE- no blurred vision, double vision  ENT-, no difficulty swallowing, no symptoms of oral candidiasis  Lungs- no complaint of respiratory distress, no wheezing  CV- no chest pain, palpitation  GI- no nausea, diarrhea, no abdominal pain  GU- no difficulty voiding, no flank pain  MS- no significant edema  Remainder of 10 point review of system is negative    Patient Data   Patient Vitals for the past 24 hrs:   BP SpO2   04/02/22 1205 -- 96 %   04/02/22 0700 131/85 95 %   04/02/22 0600 117/72 100 %   04/02/22 0500 114/68 97 %   04/02/22 0400 109/68 99 %   04/02/22 0300 107/67 94 %   04/01/22 2130 -- 97 %   04/01/22 2115 -- 98 %   04/01/22 2100 (!) 121/57 97 %   04/01/22 2030 -- 96 %   04/01/22 2015 -- 96 %   04/01/22 2000 129/69 95 %   04/01/22 1945 -- 93 %   04/01/22 1930 -- 94 %   04/01/22 1915 -- 94 %   04/01/22 1900 129/77 (!) 89 %   04/01/22 1845 -- 95 %   04/01/22 1830 -- 95 %   04/01/22 1815 -- 95 %   04/01/22 1800 127/70 97 %   04/01/22 1745 -- 97 %   04/01/22 1730 -- 95 %   04/01/22 1715 -- 97 %   04/01/22 1700 127/75 --     General:  Patient is resting in bed, no acute distress, alert and oriented x3   Eyes:  PERRL, no scleral icterus   HENT:  Normocephalic, atraumatic, oral mucosa is moist and pink, no nasal discharge   Heart:  RRR, S1 and S2 auscultated, no murmurs appreciated   Lungs:  Unlabored respirations.  Lungs are clear to auscultation bilaterally, no wheezes, no rales  Abdomen:  Soft, active bowel sounds, non-tender to palpation, non-distended  Extremities:  Pulses equal in all extremities bilaterally.  Capillary refill less than 3 seconds.  No edema in lower extremities bilaterally   Skin:  Warm and dry.  Not diaphoretic  Neuro:  A&O x 3.  No focal deficits.  Speech intact.  Not tremulous  Psych:  Cooperative, not agitated    Intake/Output  Summary (Last 24 hours) at 04/02/2022 1404  Last data filed at 04/01/2022 2100  Gross per 24 hour   Intake 1100 ml   Output --   Net 1100 ml     Current Facility-Administered Medications   Medication Dose Route Frequency Provider Last Rate Last Admin   . 1/2 NS premix infusion   Intravenous Continuous Julian Hy, MD 100 mL/hr at 04/02/22 1202 New Bag at 04/02/22 1202   . acetaminophen (TYLENOL) tablet  650 mg Oral Q4H PRN Julian Hy, MD   650 mg at 04/02/22 1347   . aluminum-magnesium hydroxide-simethicone (MAG-AL PLUS) 200-200-20 mg per 5 mL oral liquid  10 mL Oral Q4H PRN Julian Hy, MD       . famotidine (PEPCID) tablet  40 mg Oral 2x/day Gardner Candle, FNP-BC   40 mg at 04/02/22 0932   . hydroCHLOROthiazide (HYDRODIURIL) tablet  25 mg Oral Daily Julian Hy, MD   25 mg at 04/02/22 0932   . hydrocortisone (ANUSOL-HC) rectal suppository  25  mg Rectal 2x/day PRN Gardner Candle, FNP-BC       . losartan (COZAAR) tablet  50 mg Oral Daily Gardner Candle, FNP-BC   50 mg at 04/02/22 0933   . metoprolol succinate (TOPROL-XL) 24 hr extended release tablet  25 mg Oral Daily Gardner Candle, FNP-BC   25 mg at 04/02/22 0932   . morphine 4 mg/mL injection  4 mg Intravenous Q4H PRN Julian Hy, MD       . ondansetron [facility]) 2 mg/mL injection  4 mg Intravenous Q8H PRN Julian Hy, MD   4 mg at 04/02/22 1202   . oxyCODONE-acetaminophen (PERCOCET) 5-325mg  per tablet  1 Tablet Oral Q4H PRN Julian Hy, MD       . pantoprazole (PROTONIX) injection  40 mg Intravenous Q12H Julian Hy, MD   40 mg at 04/02/22 0535   . piperacillin-tazobactam (ZOSYN) 3.375 g in NS 100 mL IVPB minibag  3.375 g Intravenous Q8H Julian Hy, MD   Stopped at 04/02/22 1355   . propranolol (INDERAL) tablet  40 mg Oral NIGHTLY Hamblin, Cathy, FNP-BC       . sucralfate (CARAFATE) tablet   1 g Oral 2x/day AC Gardner Candle, FNP-BC   1 g at 04/02/22 0932   . tolterodine (DETROL) tablet  2 mg Oral Q12H Gardner Candle, FNP-BC   2 mg at 04/02/22 0535   . venlafaxine (EFFEXOR XR) 24 hr extended release capsule  75 mg Oral Daily Gardner Candle, FNP-BC   75 mg at 04/02/22 1301     Current Outpatient Medications   Medication Sig Dispense Refill   . famotidine (PEPCID) 40 mg Oral Tablet Take 1 Tablet (40 mg total) by mouth Twice daily     . hydrocortisone acetate (ANUSOL-HC) 25 mg Rectal Suppository Insert 1 Suppository (25 mg total) into the rectum Twice per day as needed     . losartan (COZAAR) 50 mg Oral Tablet Take 1 Tablet (50 mg total) by mouth Once a day     . metoprolol succinate (TOPROL-XL) 25 mg Oral Tablet Sustained Release 24 hr Take 1 Tablet (25 mg total) by mouth Once a day     . omeprazole (PRILOSEC) 40 mg Oral Capsule, Delayed Release(E.C.) Take 1 Capsule (40 mg total) by mouth Twice daily     . solifenacin (VESICARE) 10 mg Oral Tablet Take 1 Tablet (10 mg total) by mouth Once a day     . sucralfate (CARAFATE) 1 gram Oral Tablet Take 1 Tablet (1 g total) by mouth Twice a day before meals     . venlafaxine (EFFEXOR XR) 75 mg Oral Capsule, Sust. Release 24 hr Take 1 Capsule (75 mg total) by mouth Once a day         Lab Review:  I have reviewed all lab results.    Image Review:  I have reviewed all available imaging results.       Assessment and Plan:    1. Colitis: seen by GI who recommend outpatient colonoscopy. Continue abx, IVFs, supportive care.  2. HTN: BP ok.    Julian Hy, MD

## 2022-04-02 NOTE — ED Nurses Note (Signed)
Pt requesting Tylenol for a migraine that is starting.  Will check MAR

## 2022-04-03 ENCOUNTER — Encounter (HOSPITAL_COMMUNITY): Payer: Self-pay | Admitting: Internal Medicine

## 2022-04-03 DIAGNOSIS — R42 Dizziness and giddiness: Secondary | ICD-10-CM

## 2022-04-03 LAB — BASIC METABOLIC PANEL
ANION GAP: 7 mmol/L — ABNORMAL LOW (ref 10–20)
BUN/CREA RATIO: 16 (ref 6–22)
BUN: 13 mg/dL (ref 7–25)
CALCIUM: 9.1 mg/dL (ref 8.6–10.3)
CHLORIDE: 103 mmol/L (ref 98–107)
CO2 TOTAL: 29 mmol/L (ref 21–31)
CREATININE: 0.82 mg/dL (ref 0.60–1.30)
ESTIMATED GFR: 82 mL/min/{1.73_m2} (ref >59)
GLUCOSE: 73 mg/dL — ABNORMAL LOW (ref 74–109)
OSMOLALITY, CALCULATED: 276 mOsm/kg (ref 270–290)
POTASSIUM: 4.1 mmol/L (ref 3.5–5.1)
SODIUM: 139 mmol/L (ref 136–145)

## 2022-04-03 LAB — CBC
HCT: 39 % (ref 37.0–47.0)
HGB: 13.8 g/dL (ref 12.5–16.0)
MCH: 32.5 pg — ABNORMAL HIGH (ref 27.0–32.0)
MCHC: 35.2 g/dL (ref 32.0–36.0)
MCV: 92.4 fL (ref 78.0–99.0)
MPV: 9 fL (ref 7.4–10.4)
PLATELETS: 224 10*3/uL (ref 140–440)
RBC: 4.23 10*6/uL (ref 4.20–5.40)
RDW: 12.3 % (ref 11.6–14.8)
WBC: 6.7 10*3/uL (ref 4.0–10.5)
WBCS UNCORRECTED: 6.7 10*3/uL

## 2022-04-03 LAB — URINE CULTURE,ROUTINE: URINE CULTURE: 50000 — AB

## 2022-04-03 MED ORDER — PROPRANOLOL 10 MG TABLET
ORAL_TABLET | ORAL | Status: AC
Start: 2022-04-03 — End: 2022-04-03
  Filled 2022-04-03: qty 4

## 2022-04-03 MED ORDER — FAMOTIDINE 20 MG TABLET
ORAL_TABLET | ORAL | Status: AC
Start: 2022-04-03 — End: 2022-04-03
  Filled 2022-04-03: qty 2

## 2022-04-03 MED ORDER — ACETAMINOPHEN 325 MG TABLET
ORAL_TABLET | ORAL | Status: AC
Start: 2022-04-03 — End: 2022-04-03
  Filled 2022-04-03: qty 2

## 2022-04-03 MED ORDER — METOPROLOL SUCCINATE ER 25 MG TABLET,EXTENDED RELEASE 24 HR
ORAL_TABLET | ORAL | Status: AC
Start: 2022-04-03 — End: 2022-04-03
  Filled 2022-04-03: qty 1

## 2022-04-03 MED ORDER — HYDROCHLOROTHIAZIDE 25 MG TABLET
ORAL_TABLET | ORAL | Status: AC
Start: 2022-04-03 — End: 2022-04-03
  Filled 2022-04-03: qty 1

## 2022-04-03 MED ORDER — PIPERACILLIN-TAZOBACTAM 3.375 GRAM INTRAVENOUS SOLUTION
INTRAVENOUS | Status: AC
Start: 2022-04-03 — End: 2022-04-03
  Filled 2022-04-03: qty 15

## 2022-04-03 MED ORDER — SODIUM CHLORIDE 0.9 % INTRAVENOUS PIGGYBACK
INJECTION | INTRAVENOUS | Status: AC
Start: 2022-04-03 — End: 2022-04-03
  Filled 2022-04-03: qty 100

## 2022-04-03 MED ORDER — SUCRALFATE 1 GRAM TABLET
ORAL_TABLET | ORAL | Status: AC
Start: 2022-04-03 — End: 2022-04-03
  Filled 2022-04-03: qty 1

## 2022-04-03 MED ORDER — PANTOPRAZOLE 40 MG INTRAVENOUS SOLUTION
INTRAVENOUS | Status: AC
Start: 2022-04-03 — End: 2022-04-03
  Filled 2022-04-03: qty 10

## 2022-04-03 MED ORDER — SODIUM CHLORIDE 0.45 % INTRAVENOUS SOLUTION
INTRAVENOUS | Status: DC
Start: 2022-04-03 — End: 2022-04-04

## 2022-04-03 MED ORDER — LOSARTAN 50 MG TABLET
ORAL_TABLET | ORAL | Status: AC
Start: 2022-04-03 — End: 2022-04-03
  Filled 2022-04-03: qty 1

## 2022-04-03 MED ORDER — OXYCODONE-ACETAMINOPHEN 5 MG-325 MG TABLET
ORAL_TABLET | ORAL | Status: AC
Start: 2022-04-03 — End: 2022-04-03
  Filled 2022-04-03: qty 1

## 2022-04-03 NOTE — Progress Notes (Signed)
Subjective: Feeling better. Having some lightheadedness.  Objective:  Vital signs: (most recent): Blood pressure 115/69, pulse 78, temperature 36.7 C (98 F), resp. rate 18, height 1.6 m (5\' 3" ), weight 85.3 kg (188 lb), SpO2 97 %.    : NAD, Husband at bedside.        ROS  Constitutional- No fever, night sweats, fatigue  EYE- no blurred vision, double vision  ENT-, no difficulty swallowing, no symptoms of oral candidiasis  Lungs- no complaint of respiratory distress, no wheezing  CV- no chest pain, palpitation  GI- no nausea, diarrhea, no abdominal pain  GU- no difficulty voiding, no flank pain  MS- no significant edema  Remainder of 10 point review of system is negative    Patient Data   Patient Vitals for the past 24 hrs:   BP Temp Pulse Resp SpO2   04/03/22 1130 -- -- -- -- 97 %   04/03/22 1115 -- -- -- -- 99 %   04/03/22 1100 -- -- -- -- 99 %   04/03/22 1045 -- -- -- -- 100 %   04/03/22 1030 -- -- -- -- 100 %   04/03/22 1015 -- -- -- -- 100 %   04/03/22 1000 -- -- -- -- 99 %   04/03/22 0945 -- -- -- -- 92 %   04/03/22 0930 -- -- -- -- 92 %   04/03/22 0915 -- -- -- -- 100 %   04/03/22 0900 115/69 -- -- -- 98 %   04/03/22 0845 116/76 36.7 C (98 F) 78 18 98 %   04/03/22 0830 -- -- -- -- 99 %   04/03/22 0815 -- -- -- -- 98 %   04/03/22 0800 -- -- -- -- 98 %   04/03/22 0745 -- -- -- -- 100 %   04/03/22 0730 -- -- -- -- 100 %   04/03/22 0715 -- -- -- -- 100 %   04/03/22 0700 103/69 -- -- -- 100 %   04/03/22 0645 -- -- -- -- 100 %   04/03/22 0630 93/75 -- -- -- 99 %   04/03/22 0615 -- -- -- -- 100 %   04/03/22 0600 109/75 -- -- -- 98 %   04/03/22 0545 -- -- -- -- 90 %   04/03/22 0530 105/61 -- -- -- 91 %   04/03/22 0515 -- -- -- -- 92 %   04/03/22 0500 108/69 -- -- -- 91 %   04/03/22 0445 -- -- -- -- 97 %   04/03/22 0430 -- -- -- -- 96 %   04/03/22 0400 93/60 -- -- -- 95 %   04/03/22 0300 (!) 90/55 -- -- -- 98 %   04/03/22 0200 (!) 94/49 -- -- -- 97 %   04/03/22 0000 104/62 -- -- -- 100 %   04/02/22 2300 98/63 -- --  -- 100 %   04/02/22 2200 110/75 -- -- -- 100 %   04/02/22 2100 112/73 -- -- -- 100 %   04/02/22 2038 103/80 -- 78 -- --   04/02/22 1805 110/70 -- -- -- 95 %   04/02/22 1705 98/61 -- -- -- 93 %   04/02/22 1605 126/74 -- -- -- 95 %   04/02/22 1505 119/71 -- -- -- 94 %   04/02/22 1405 113/78 -- -- -- 95 %   04/02/22 1350 101/88 -- -- -- 98 %     General:  Patient is resting in bed, no acute distress, alert and oriented x3  Eyes:  PERRL, no scleral icterus   HENT:  Normocephalic, atraumatic, oral mucosa is moist and pink, no nasal discharge   Heart:  RRR, S1 and S2 auscultated, no murmurs appreciated   Lungs:  Unlabored respirations.  Lungs are clear to auscultation bilaterally, no wheezes, no rales  Abdomen:  Soft, active bowel sounds, non-tender to palpation, non-distended  Extremities:  Pulses equal in all extremities bilaterally.  Capillary refill less than 3 seconds.  No edema in lower extremities bilaterally   Skin:  Warm and dry.  Not diaphoretic  Neuro:  A&O x 3.  No focal deficits.  Speech intact.  Not tremulous  Psych:  Cooperative, not agitated    Intake/Output Summary (Last 24 hours) at 04/03/2022 1253  Last data filed at 04/03/2022 0445  Gross per 24 hour   Intake 251.67 ml   Output --   Net 251.67 ml     Current Facility-Administered Medications   Medication Dose Route Frequency Provider Last Rate Last Admin   . 1/2 NS premix infusion   Intravenous Continuous Mahalia Longest, MD       . acetaminophen (TYLENOL) tablet  650 mg Oral Q4H PRN Mahalia Longest, MD   650 mg at 04/02/22 2126   . aluminum-magnesium hydroxide-simethicone (MAG-AL PLUS) 200-200-20 mg per 5 mL oral liquid  10 mL Oral Q4H PRN Mahalia Longest, MD       . famotidine (PEPCID) tablet  40 mg Oral 2x/day Laurita Quint, FNP-BC   40 mg at 04/03/22 0841   . hydroCHLOROthiazide (HYDRODIURIL) tablet  25 mg Oral Daily Mahalia Longest, MD   25 mg at 04/03/22 0841   . hydrocortisone  (ANUSOL-HC) rectal suppository  25 mg Rectal 2x/day PRN Laurita Quint, FNP-BC       . losartan (COZAAR) tablet  50 mg Oral Daily Laurita Quint, FNP-BC   50 mg at 04/03/22 0841   . metoprolol succinate (TOPROL-XL) 24 hr extended release tablet  25 mg Oral Daily Laurita Quint, FNP-BC   25 mg at 04/03/22 0841   . morphine 4 mg/mL injection  4 mg Intravenous Q4H PRN Mahalia Longest, MD       . ondansetron [facility]) 2 mg/mL injection  4 mg Intravenous Q8H PRN Mahalia Longest, MD   4 mg at 04/02/22 1202   . oxyCODONE-acetaminophen (PERCOCET) 5-325mg  per tablet  1 Tablet Oral Q4H PRN Mahalia Longest, MD       . pantoprazole (PROTONIX) injection  40 mg Intravenous Q12H Mahalia Longest, MD   40 mg at 04/03/22 0506   . piperacillin-tazobactam (ZOSYN) 3.375 g in NS 100 mL IVPB minibag  3.375 g Intravenous Q8H Mahalia Longest, MD 25 mL/hr at 04/03/22 0841 3.375 g at 04/03/22 0841   . propranolol (INDERAL) tablet  40 mg Oral NIGHTLY Hamblin, Cathy, FNP-BC   40 mg at 04/02/22 2038   . sucralfate (CARAFATE) tablet  1 g Oral 2x/day AC Laurita Quint, FNP-BC   1 g at 04/03/22 0841   . tolterodine (DETROL) tablet  2 mg Oral Q12H Laurita Quint, FNP-BC   2 mg at 04/03/22 0506   . venlafaxine (EFFEXOR XR) 24 hr extended release capsule  75 mg Oral Daily Laurita Quint, FNP-BC   75 mg at 04/02/22 1301     Current Outpatient Medications   Medication Sig Dispense Refill   . famotidine (PEPCID) 40 mg Oral Tablet Take 1 Tablet (40 mg total) by mouth Twice daily     .  hydrocortisone acetate (ANUSOL-HC) 25 mg Rectal Suppository Insert 1 Suppository (25 mg total) into the rectum Twice per day as needed     . losartan (COZAAR) 50 mg Oral Tablet Take 1 Tablet (50 mg total) by mouth Once a day     . metoprolol succinate (TOPROL-XL) 25 mg Oral Tablet Sustained Release 24 hr Take 1 Tablet (25 mg total) by mouth Once a day     . omeprazole (PRILOSEC) 40 mg Oral Capsule,  Delayed Release(E.C.) Take 1 Capsule (40 mg total) by mouth Twice daily     . solifenacin (VESICARE) 10 mg Oral Tablet Take 1 Tablet (10 mg total) by mouth Once a day     . sucralfate (CARAFATE) 1 gram Oral Tablet Take 1 Tablet (1 g total) by mouth Twice a day before meals     . venlafaxine (EFFEXOR XR) 75 mg Oral Capsule, Sust. Release 24 hr Take 1 Capsule (75 mg total) by mouth Once a day         Lab Review:  I have reviewed all lab results.    Image Review:  I have reviewed all available imaging results.       Assessment and Plan:    1. Colitis: seen by GI who recommend outpatient colonoscopy. Continue abx, IVFs, supportive care.  2. HTN: BP ok.  3. Disposition: hopefully DC in am.    Mahalia Longesthristopher Fritzpatrick Ayva Veilleux, MD

## 2022-04-03 NOTE — ED Nurses Note (Signed)
Oriented times four. Awake and alert. Mood calm and pleasant. Respirations even and nonlabored. Denies pain and discomfort at this time. No family present. Call bell within reach. Patient denies need for assistance at this time. Waiting for bed.

## 2022-04-03 NOTE — ED Nurses Note (Signed)
Report called to [address] east at this time.

## 2022-04-03 NOTE — ED Nurses Note (Signed)
Orthostatic BP:  Lying BP 105/68, HR 80  Sitting BP 111/71, 82  Standing BP 105/63, HR 75

## 2022-04-04 DIAGNOSIS — K625 Hemorrhage of anus and rectum: Secondary | ICD-10-CM

## 2022-04-04 MED ORDER — ONDANSETRON 8 MG DISINTEGRATING TABLET
8.0000 mg | ORAL_TABLET | Freq: Four times a day (QID) | ORAL | 3 refills | Status: DC | PRN
Start: 2022-04-04 — End: 2023-03-11

## 2022-04-04 MED ORDER — OXYCODONE-ACETAMINOPHEN 5 MG-325 MG TABLET
1.0000 | ORAL_TABLET | ORAL | 0 refills | Status: DC | PRN
Start: 2022-04-04 — End: 2023-03-11

## 2022-04-04 MED ORDER — AMOXICILLIN 875 MG-POTASSIUM CLAVULANATE 125 MG TABLET
1.0000 | ORAL_TABLET | Freq: Two times a day (BID) | ORAL | 0 refills | Status: DC
Start: 2022-04-04 — End: 2023-03-11

## 2022-04-04 NOTE — Care Plan (Signed)
Nutrition  Risk - wt loss    Pt admitted 2' colitis and rectal bleed.  I met with pt and her husband to discussed diet advancement and nutrition recs.  She drank some liquids this morning but c/o nausea during visit. Explained GI soft diet.  Pt avoids acidic and citrus items, tomatoes & nuts.  We discussed gastric stimulants, fiber, etc.  Pt thinks she may be lactose intolerant so reviewed options (lactaid milk, enzymes).  Pt reports a few # wt loss but nothing drastic. Wt is still adequate.    Gi soft diet is planned  Questions answered

## 2022-04-04 NOTE — Care Plan (Signed)
Problem: Adult Inpatient Plan of Care  Goal: Plan of Care Review  Outcome: Ongoing (see interventions/notes)  Flowsheets (Taken 04/04/2022 0018)  Plan of Care Reviewed With: patient     Problem: Adult Inpatient Plan of Care  Goal: Patient-Specific Goal (Individualized)  Outcome: Ongoing (see interventions/notes)  Flowsheets  Taken 04/03/2022 2239  Patient would like to participate in bedside shift report: Yes  Individualized Care Needs: assistance with ambulation, minimize N/V, pain control  Anxieties, Fears or Concerns: Nausea, rectal bleeding  Patient-Specific Goals (Include Timeframe): manage symptoms effectively  Plan of Care Reviewed With: patient  Taken 04/03/2022 2200  Individualized Care Needs: assistance with ambulation, minimize N/V, pain control  Anxieties, Fears or Concerns: none voiced  Patient-Specific Goals (Include Timeframe): manage symptoms effectively  Plan of Care Reviewed With: patient     Problem: Depression  Goal: Improved Mood  Outcome: Ongoing (see interventions/notes)  Intervention: Monitor and Manage Depressive Symptoms  Recent Flowsheet Documentation  Taken 04/03/2022 2239 by Margaretmary Eddy, RN  Supportive Measures:   active listening utilized   positive reinforcement provided   relaxation techniques promoted   self-care encouraged

## 2022-04-04 NOTE — Discharge Summary (Signed)
[facility]  DISCHARGE SUMMARY    PATIENT NAME:  Yesenia Walker, Yesenia Walker  MRN:  [id number]  DOB:  11/13/1961    ENCOUNTER DATE:  04/01/2022  INPATIENT ADMISSION DATE: 04/02/2022  DISCHARGE DATE:  04/04/2022    ATTENDING PHYSICIAN: Andrey Campanile, MD  SERVICE: PRN HOSPITALIST 1  PRIMARY CARE PHYSICIAN: Mariah Milling, DO       No lay caregiver identified.      PRIMARY DISCHARGE DIAGNOSIS: Colitis  Active Hospital Problems    Diagnosis Date Noted   . Principal Problem: Colitis [K52.9] 04/01/2022   . Primary hypertension [I10] 04/02/2022   . Bright red rectal bleeding [K62.5] 04/01/2022      Resolved Hospital Problems    Diagnosis    . UTI (urinary tract infection) [N39.0]      There are no active non-hospital problems to display for this patient.          Current Discharge Medication List      START taking these medications.      Details   amoxicillin-pot clavulanate 875-125 mg Tablet  Commonly known as: AUGMENTIN   1 Tablet, Oral, 2 TIMES DAILY  Qty: 20 Tablet  Refills: 0     ondansetron 8 mg Tablet, Rapid Dissolve  Commonly known as: ZOFRAN ODT   8 mg, Sublingual, EVERY 6 HOURS PRN  Qty: 30 Tablet  Refills: 3     oxyCODONE-acetaminophen 5-325 mg Tablet  Commonly known as: PERCOCET   1 Tablet, Oral, EVERY 4 HOURS PRN  Qty: 18 Tablet  Refills: 0        CONTINUE these medications - NO CHANGES were made during your visit.      Details   famotidine 40 mg Tablet  Commonly known as: PEPCID   40 mg, Oral, 2 TIMES DAILY  Refills: 0     hydrocortisone acetate 25 mg Suppository  Commonly known as: ANUSOL-HC   25 mg, Rectal, 2 TIMES DAILY PRN  Refills: 0     losartan 50 mg Tablet  Commonly known as: COZAAR   50 mg, Oral, DAILY  Refills: 0     metoprolol succinate 25 mg Tablet Sustained Release 24 hr  Commonly known as: TOPROL-XL   25 mg, Oral, DAILY  Refills: 0     pantoprazole 40 mg Tablet, Delayed Release (E.C.)  Commonly known as: PROTONIX   40 mg, Oral, DAILY  Refills: 0     sucralfate 1 gram Tablet  Commonly known as:  CARAFATE   1 g, Oral, 2 TIMES DAILY BEFORE MEALS  Refills: 0     venlafaxine 75 mg Capsule, Sust. Release 24 hr  Commonly known as: EFFEXOR XR   75 mg, Oral, DAILY  Refills: 0     VESIcare 10 mg Tablet  Generic drug: solifenacin   10 mg, Oral, DAILY  Refills: 0          Discharge med list refreshed?  YES     Allergies   Allergen Reactions   . Ultram [Tramadol] Itching     HOSPITAL PROCEDURE(S):   No orders of the defined types were placed in this encounter.      REASON FOR HOSPITALIZATION AND HOSPITAL COURSE   BRIEF HPI:  This is a 61 y.o., female admitted for lower abdominal pain, vomiting, and rectal bleeding.   BRIEF HOSPITAL NARRATIVE:         61yr old female presents to the ED with intermittent lower abdominal pain for 6 months, vomiting for the last few  days and rectal bleeding for 3 days.  Patient states that she had a loose stool Wednesday evening and noted bright red blood.  She states all throughout that night and the next day she felt the urge to defecate but there was no stool, only bright red blood.  She has had no dysuria but has had urinary frequency, chills without fever.  She denies flank pain.  She says that she will feel hot and then vomit. She says that the abdominal pain has been about every other week for that duration of time. She had an EGD in January of this year that showed hiatal hernia per patient.  She states some biopsies were taken but she did not follow up with results.  She has been on Carafate twice a day before meals.  She was on omeprazole and Pepcid and they doubled her medication after EGD, which she now takes Pepcid 40 at bedtime and she was taken omeprazole 40 mg daily.  She says her PCP recently took her off omeprazole and put her on Protonix, she takes 40mg  daily.  Patient has history of hypertension, interstitial cystitis, and migraines.  She does take metoprolol for an elevated heart rate.  She describes the lower abdominal pain when it hits as sharp and intermittent. Her  last Pap smear was 2021.  She is on estradiol cream that she takes twice a week.  Patient's hemoglobin was stable at 14.8 and hematocrit was 43.  Patient's urine was turbid with ketones 80, protein 30, leuko esterase 250 and WBC 6.  They did a CT of the abdomen/pelvis that showed some questionable mild wall thickening to the distal descending colon and proximal sigmoid colon concerning for mild colitis.  Patient states this is the 3rd time she has had come to the ER in the last 6 months with abdominal pain.  Patient states on Wednesday night when she was having the rectal bleeding she felt like she might pass out.  She has no prior history of rectal bleeding and is not on any anticoagulation medications or aspirin.  Patient will be admitted to the hospitalist services.      Admitted, w/ consult placed to GI. Abx, IVF, supportive. GI recommending outpx. Colonoscopy.     Cleared by GI for d/c to finish course of Augmentin.     TRANSITION/POST DISCHARGE CARE/PENDING TESTS/REFERRALS:     CONDITION ON DISCHARGE:  A. Ambulation: Full ambulation  B. Self-care Ability: Complete  C. Cognitive Status Oriented x 3  D. Code status at discharge:       LINES/DRAINS/WOUNDS AT DISCHARGE:   Patient Lines/Drains/Airways Status     Active Line / Dialysis Catheter / Dialysis Graft / Drain / Airway / Wound     Name Placement date Placement time Site Days    Peripheral IV Left Median Cubital  (antecubital fossa) 04/01/22  1258  -- 3                DISCHARGE DISPOSITION:  Home discharge  DISCHARGE INSTRUCTIONS:    No discharge procedures on file.       04/03/22, MD    Copies sent to Care Team       Relationship Specialty Notifications Start End    Andrey Campanile, DO PCP - General FAMILY MEDICINE All results, Admissions 04/01/22     Phone: [phone] Fax: [phone]         [address] [place] [phone] [place]          Referring  providers can utilize [email or web address] to access their referred [place] patient's information.

## 2022-04-04 NOTE — Nurses Notes (Signed)
Discharge instructions reviewed with patient and all questions asked were answered. Printed script for pain medication sent with patient. IV removed. Telemetry monitor returned. Patient gathered all belongings and ambulated off unit with spouse.

## 2022-04-04 NOTE — Progress Notes (Signed)
Patient presented to the unit @2230 . Patient is alert and oriented x4 and able to follow command. Patient has been oriented to room, plan of care and demonstrated back the use of the call bell. Patient denies any pain at this time. All questions answered by patient.  Currently resting in bed. IV still infusing. No complains at this time.

## 2022-04-11 ENCOUNTER — Other Ambulatory Visit: Payer: Self-pay

## 2022-07-08 IMAGING — US ABD LIMITED
1 series · 14 of 25 positions shown · non-contrast
Comparison: 10/20/2020.

﻿EXAM: BOAVA PROFESSIONAL READ ABD U/S LMTD
INDICATION: Fatty liver.

[Series 1: abd limited · 14 of 48 slices shown]
[im 1/48]
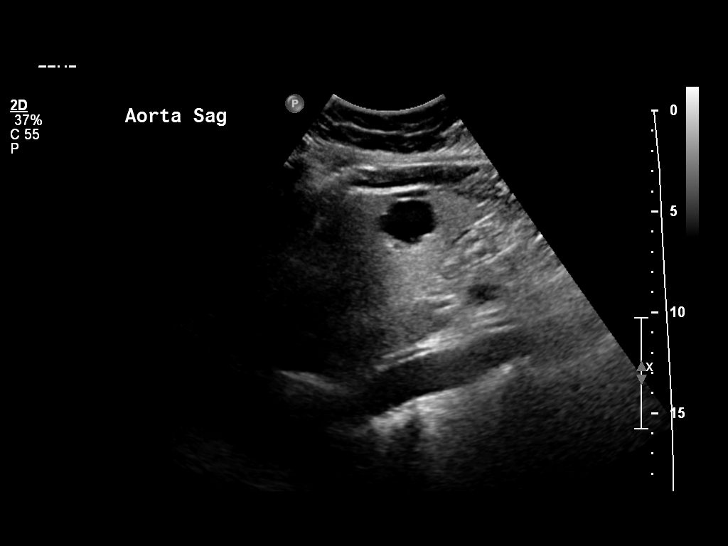
[im 4/48]
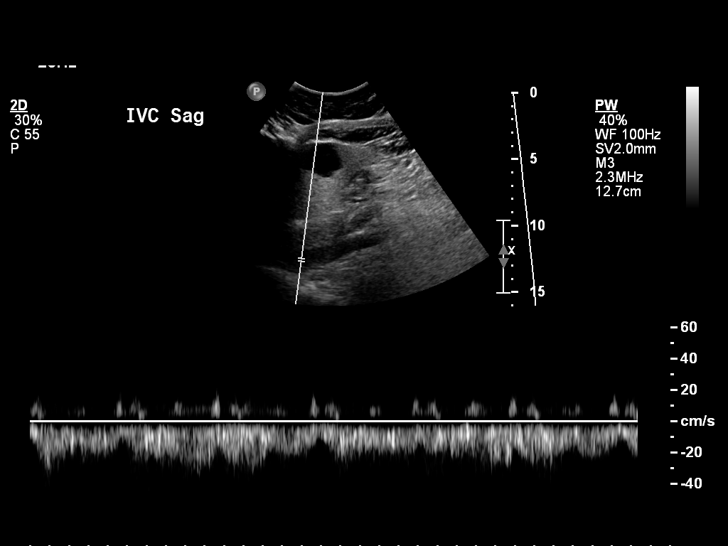
[im 8/48]
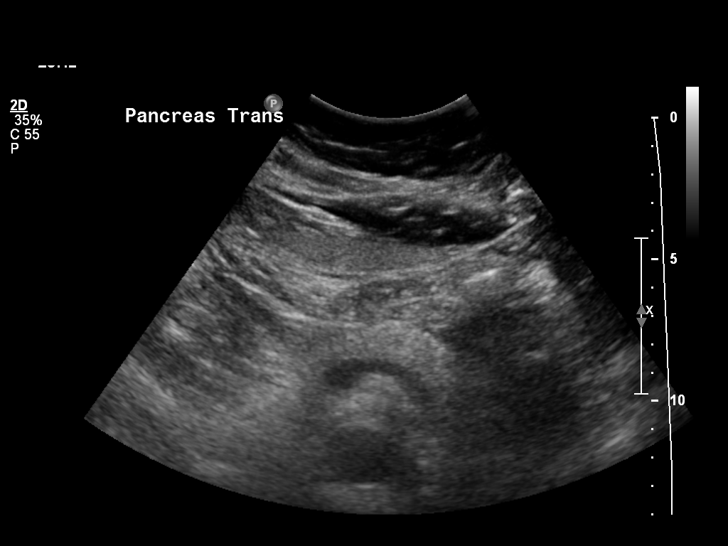
[im 12/48]
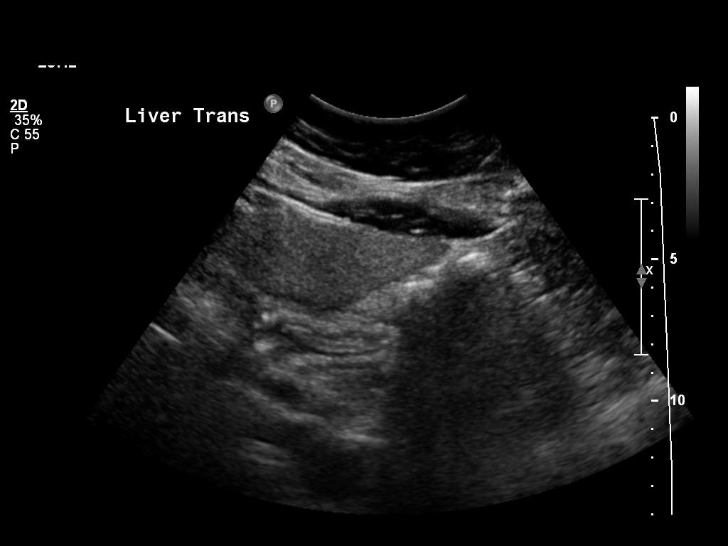
[im 16/48]
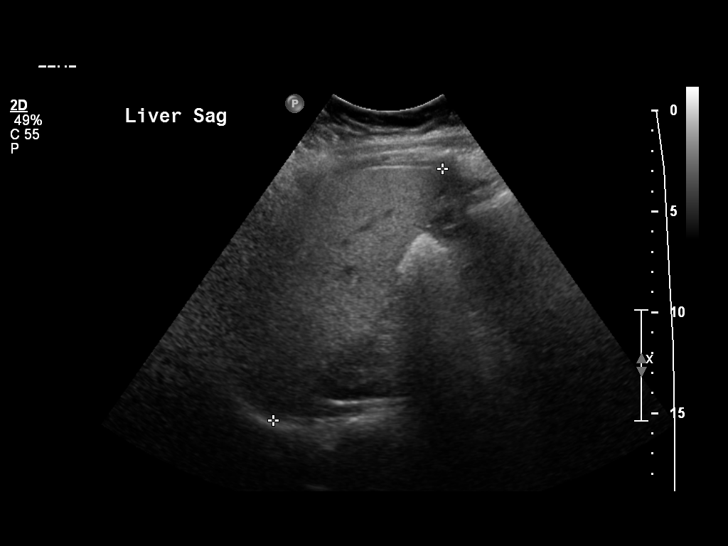
[im 18/48]
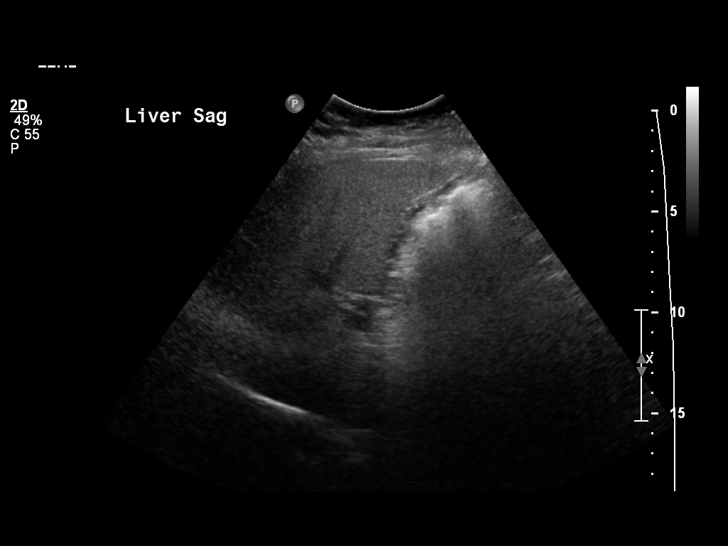
[im 22/48]
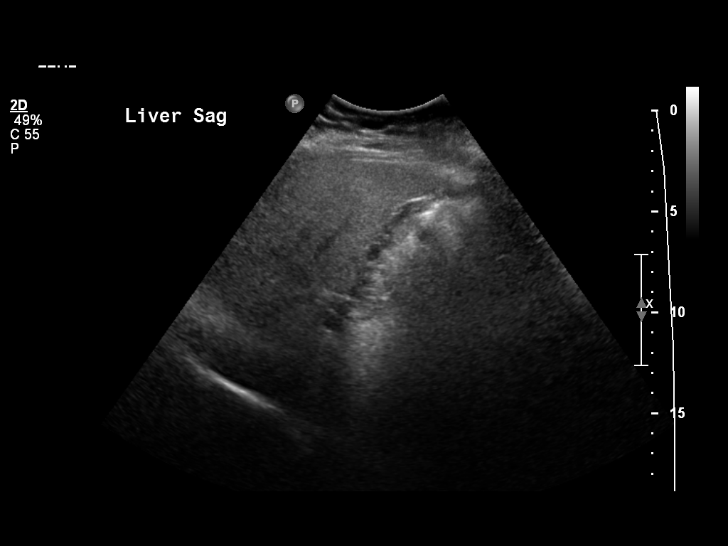
[im 26/48]
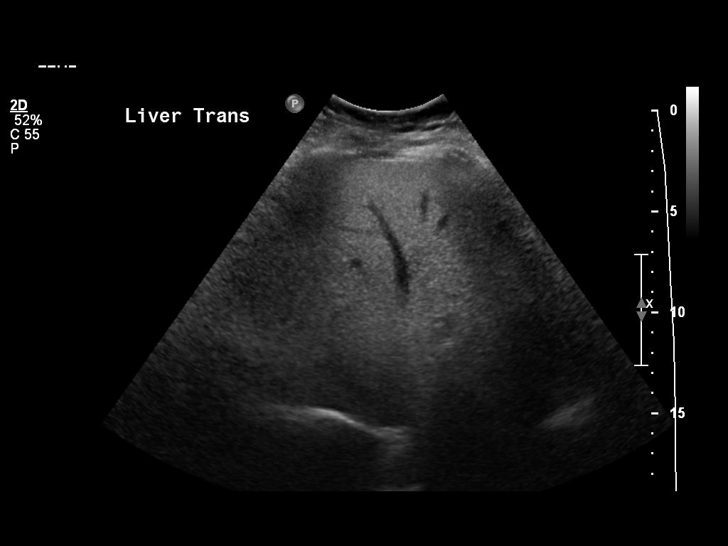
[im 30/48]
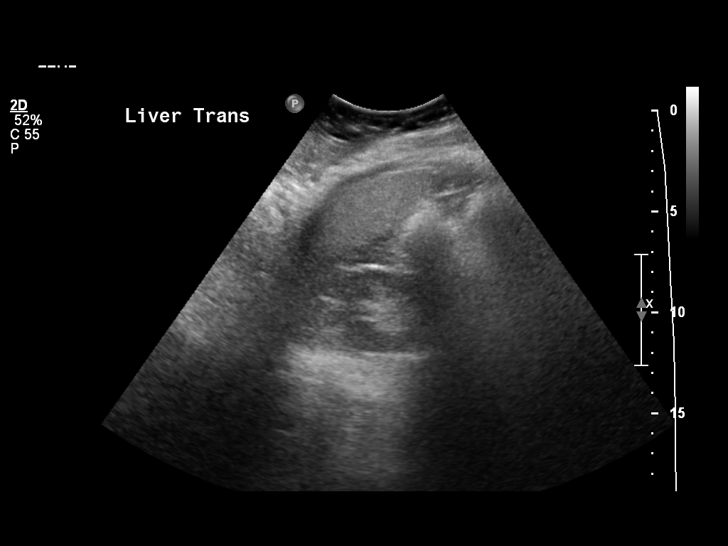
[im 32/48]
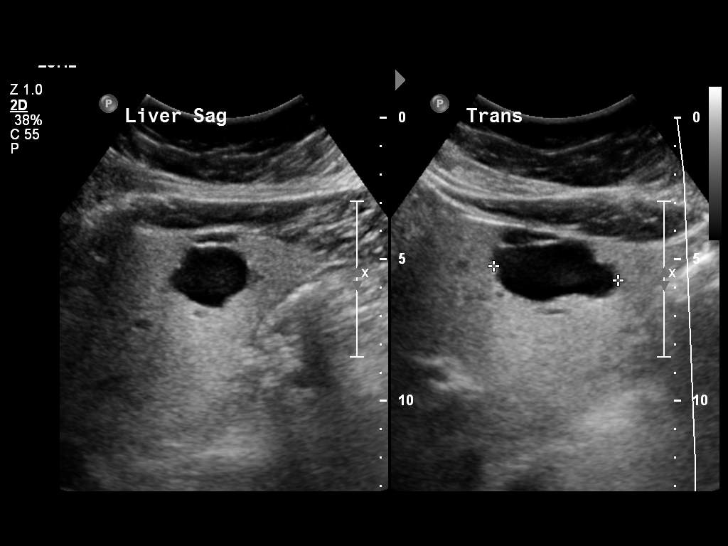
[im 36/48]
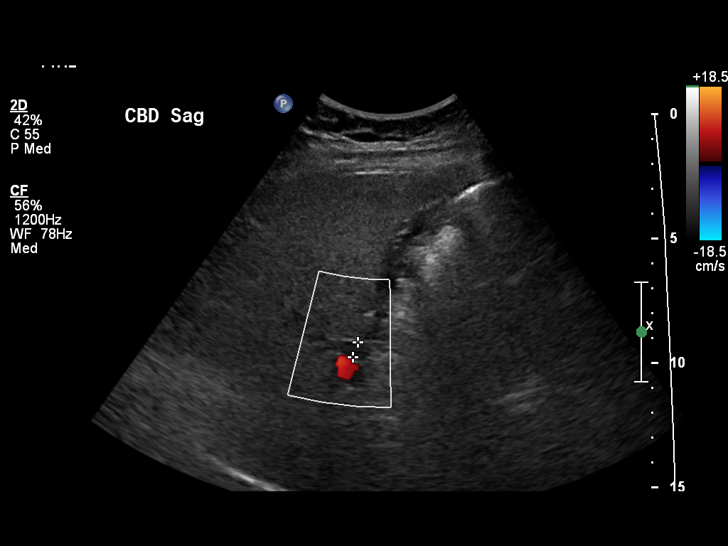
[im 40/48]
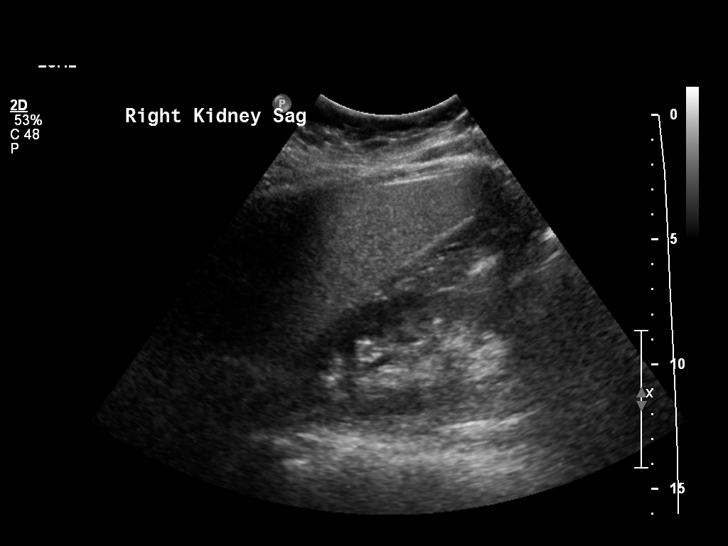
[im 44/48]
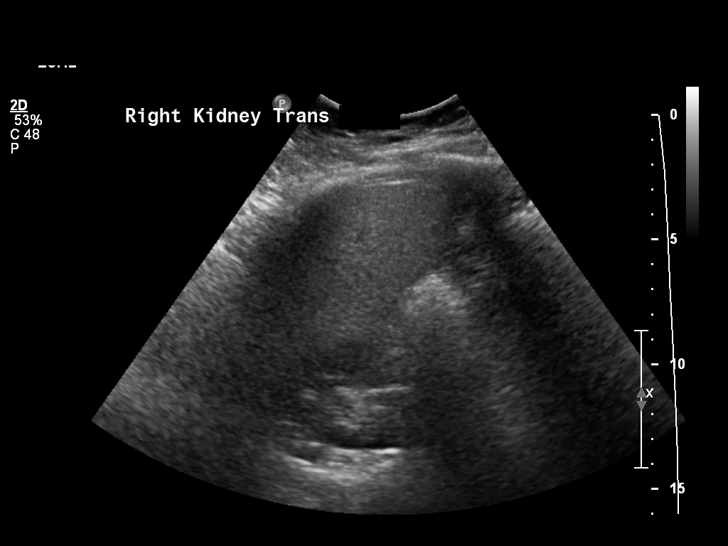
[im 48/48]
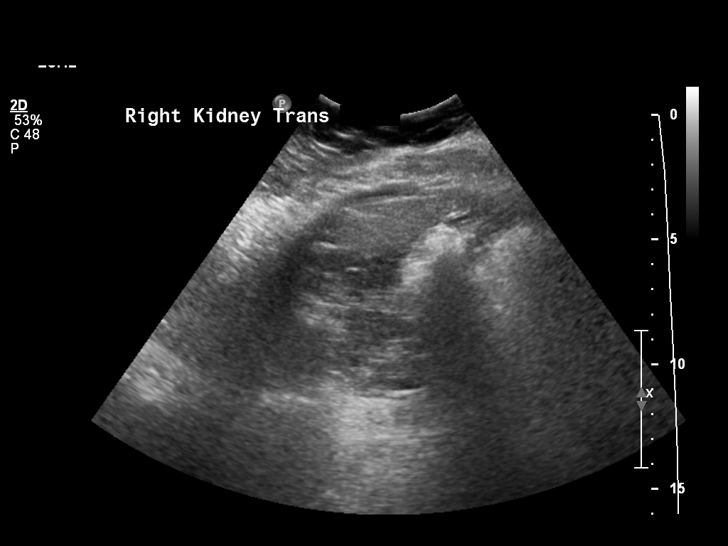

[14 of 25 positions shown; findings below may reference images not displayed]

FINDINGS: Liver is echogenic compatible with fatty infiltration. Fatty infiltration limits evaluation for focal hepatic mass. A 4 cm hepatic cyst is again noted. There is no intra or extrahepatic biliary ductal dilation. Common bile duct measures 6 mm. Gallbladder is surgically absent. Pancreas is incompletely visualized due to artifact from overlying bowel gas. Right kidney measures 10.5 cm and is unremarkable. 

Visualized abdominal aorta is without aneurysmal dilatation. IVC is normal. Portal vein measures 11 mm in diameter and demonstrates appropriate hepatopetal direction of flow. Hepatic veins are also patent. There is no ascites.
IMPRESSION: 1. Stable fatty liver. Stable 4 cm hepatic cyst.

2. Prior cholecystectomy.

3. Pancreas incompletely visualized due to artifact from overlying bowel gas.

## 2022-09-01 ENCOUNTER — Encounter (HOSPITAL_COMMUNITY): Payer: Self-pay

## 2022-09-01 ENCOUNTER — Other Ambulatory Visit (HOSPITAL_COMMUNITY): Payer: Self-pay | Admitting: Family Medicine

## 2022-09-01 DIAGNOSIS — Z1231 Encounter for screening mammogram for malignant neoplasm of breast: Secondary | ICD-10-CM

## 2022-09-08 ENCOUNTER — Inpatient Hospital Stay
Admission: RE | Admit: 2022-09-08 | Discharge: 2022-09-08 | Disposition: A | Discharge status: Home / Self Care | Payer: Medicare Other | Source: Ambulatory Visit | Attending: Family Medicine | Admitting: Family Medicine

## 2022-09-08 ENCOUNTER — Other Ambulatory Visit: Payer: Self-pay

## 2022-09-08 ENCOUNTER — Encounter (HOSPITAL_COMMUNITY): Payer: Self-pay

## 2022-09-08 ENCOUNTER — Other Ambulatory Visit (HOSPITAL_COMMUNITY): Payer: Self-pay

## 2022-09-08 DIAGNOSIS — Z1231 Encounter for screening mammogram for malignant neoplasm of breast: Secondary | ICD-10-CM | POA: Insufficient documentation

## 2022-09-29 IMAGING — US US ABDOMEN COMPLETE
1 series · 14 of 25 positions shown · non-contrast
Comparison: December 30, 2021

﻿EXAM: US ABDOMEN COMPLETE
INDICATION: Fatty liver.

[Series 1: us abdomen complete · 14 of 49 slices shown]
[im 1/49]
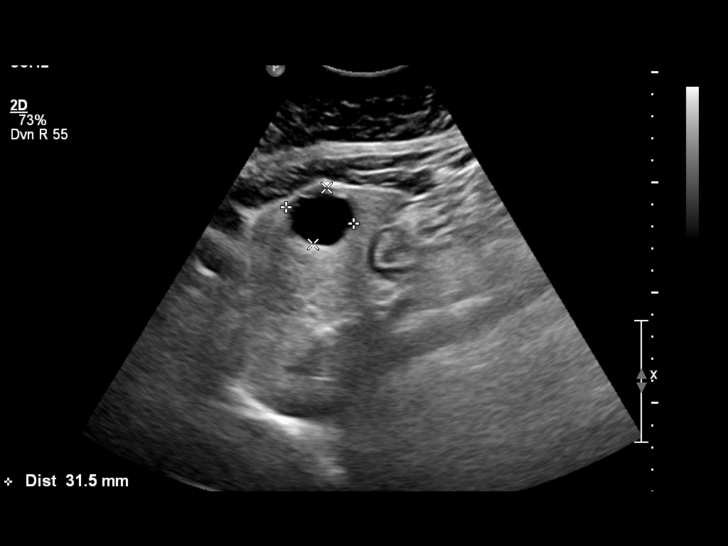
[im 5/49]
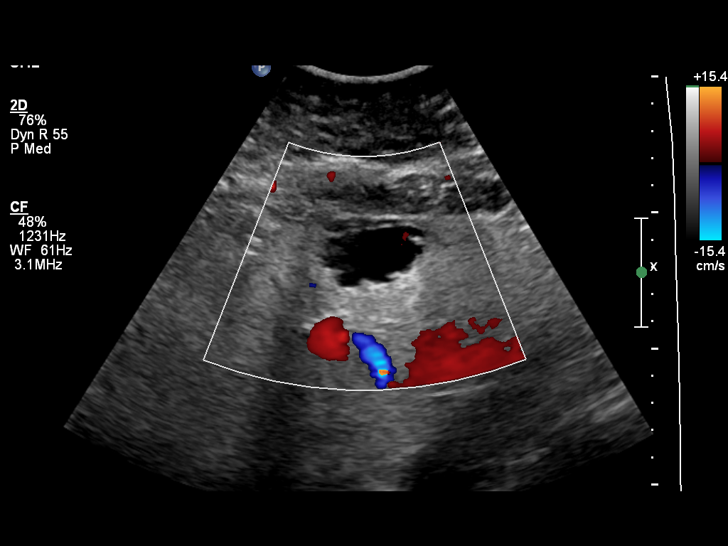
[im 9/49]
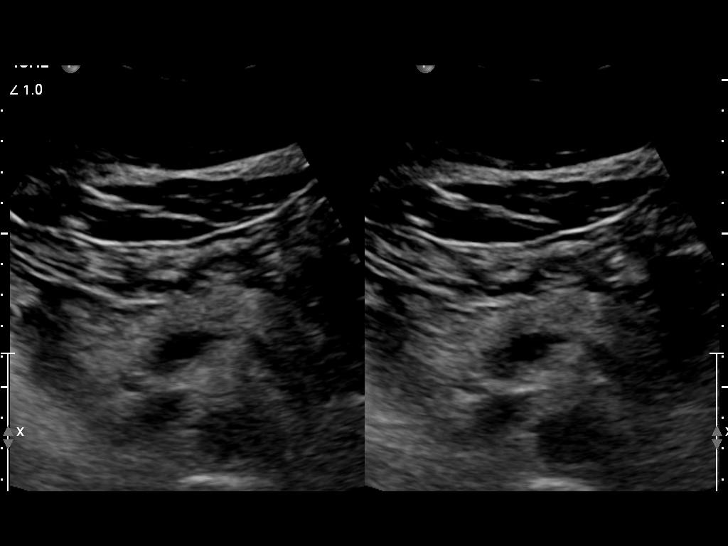
[im 13/49]
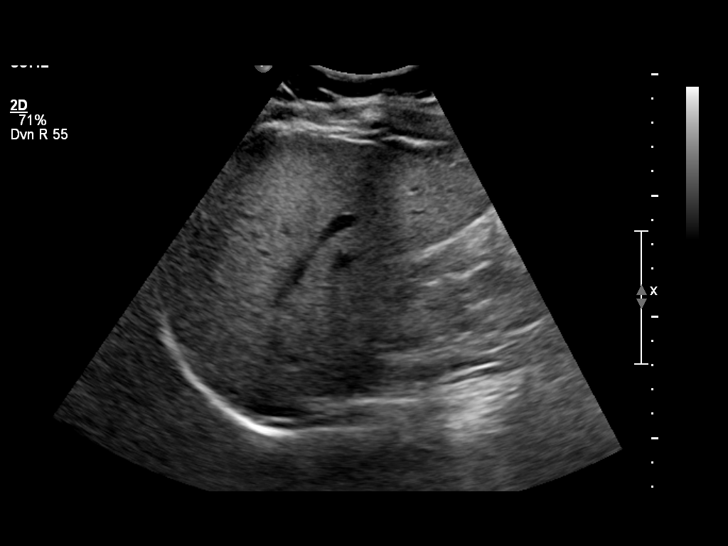
[im 17/49]
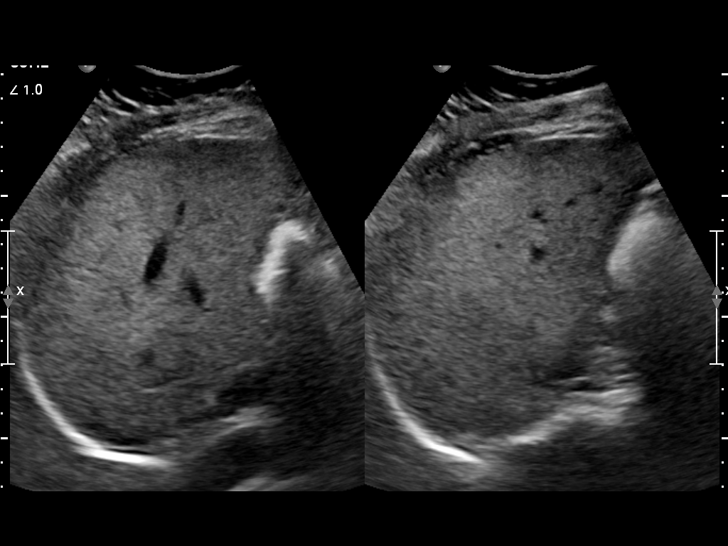
[im 19/49]
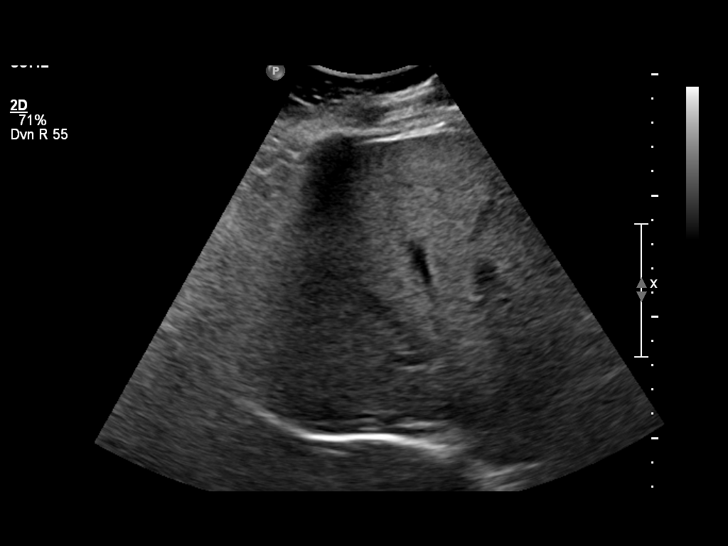
[im 23/49]
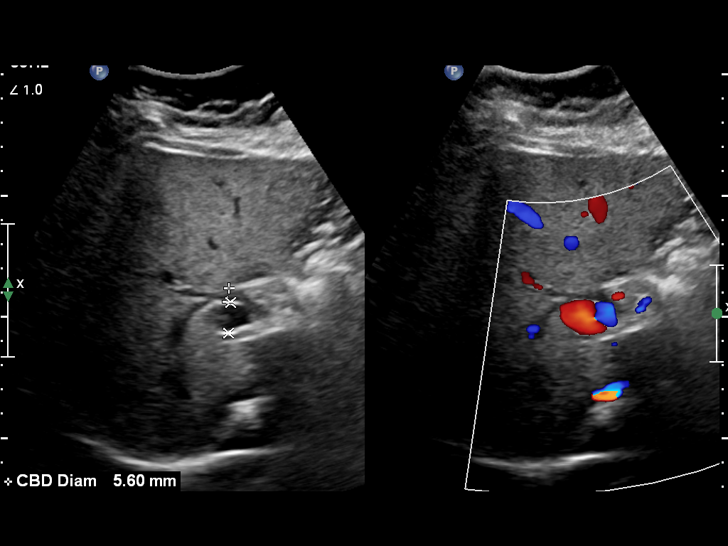
[im 27/49]
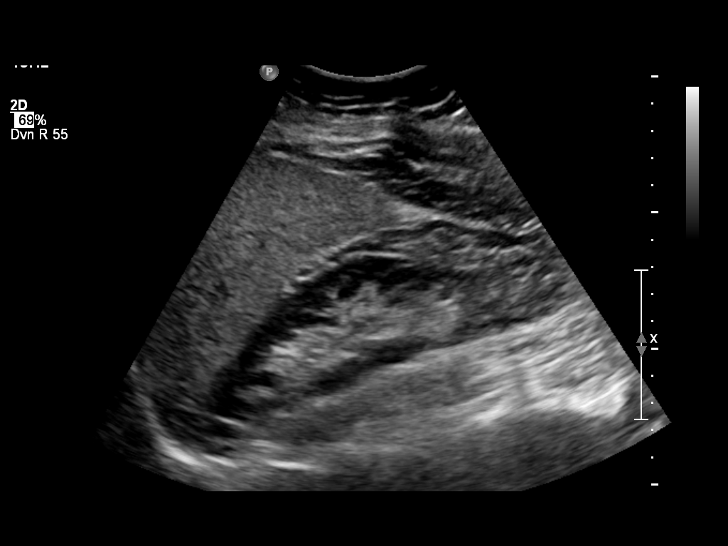
[im 31/49]
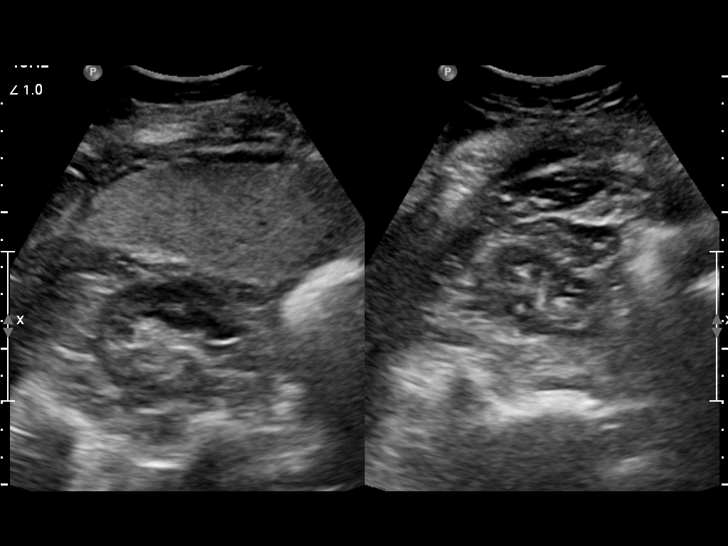
[im 33/49]
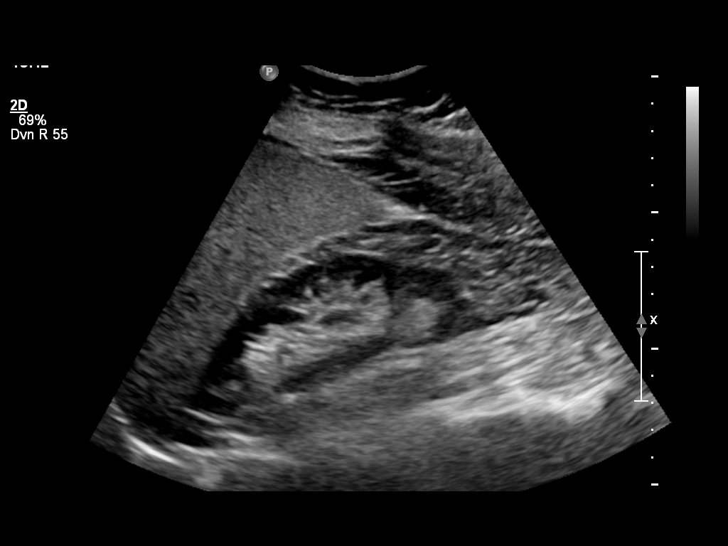
[im 37/49]
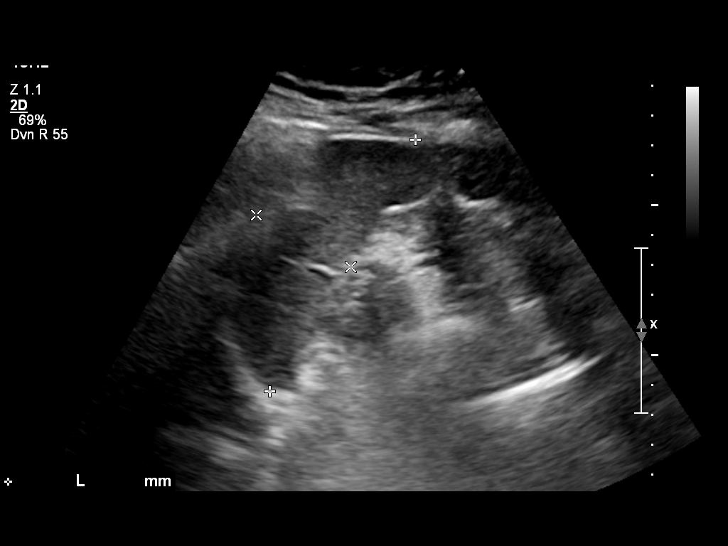
[im 41/49]
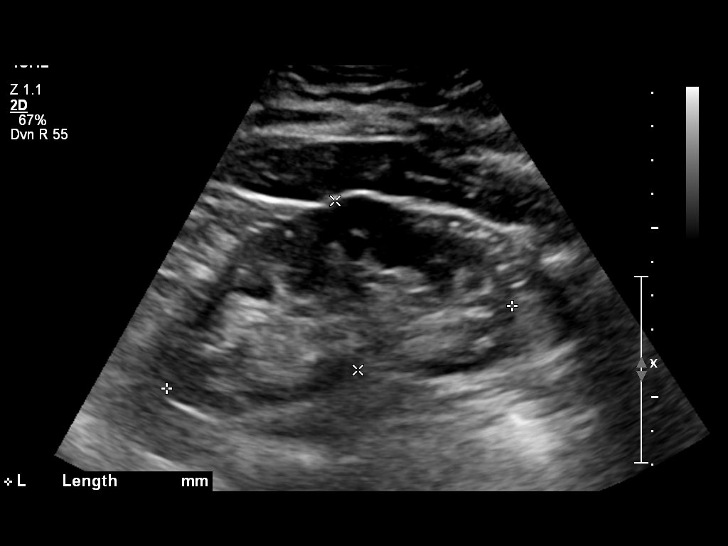
[im 45/49]
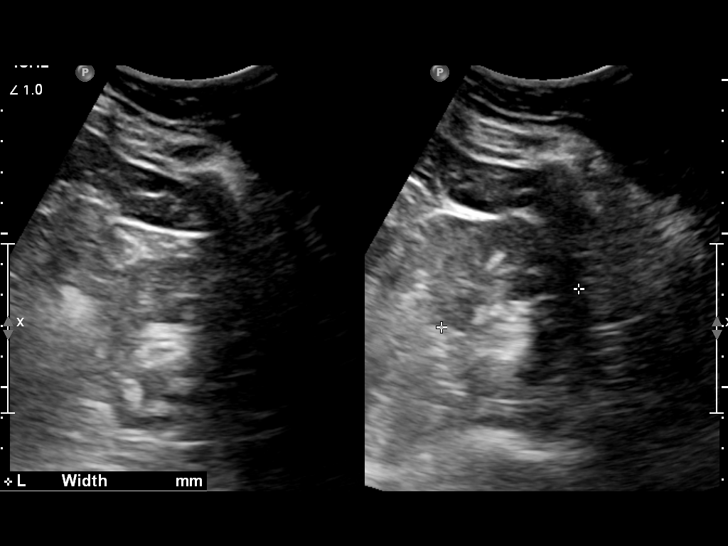
[im 49/49]
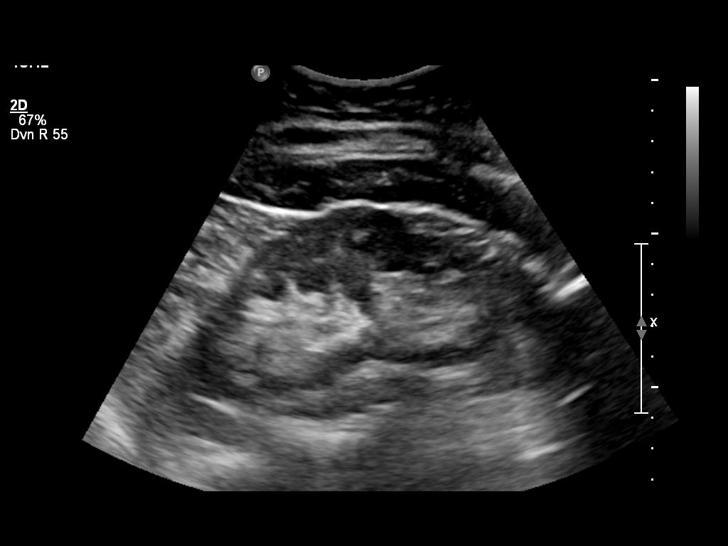

[14 of 25 positions shown; findings below may reference images not displayed]

FINDINGS: There is diffuse fatty liver infiltration.  A 4 cm cyst is noted in the left lobe of the liver.  No solid hepatic mass or biliary dilatation is seen.  The common duct is of normal caliber.  

The gallbladder is absent.  

The visualized pancreas, inferior vena cava, and aorta appear unremarkable.  The spleen is not enlarged.  

Both kidneys appear normal in size and appearance with no evidence of hydronephrosis or nephrolithiasis. 

Compared to the prior exam dated December 30, 2021, there has been no significant change.
IMPRESSION: Diffuse fatty liver infiltration and 4 cm hepatic cyst, unchanged.

## 2022-10-19 ENCOUNTER — Other Ambulatory Visit (HOSPITAL_COMMUNITY): Payer: Self-pay | Admitting: Cardiovascular Disease

## 2022-10-19 DIAGNOSIS — R002 Palpitations: Secondary | ICD-10-CM

## 2022-10-30 ENCOUNTER — Ambulatory Visit (HOSPITAL_COMMUNITY): Payer: Self-pay

## 2022-12-11 ENCOUNTER — Other Ambulatory Visit: Payer: Self-pay

## 2022-12-11 ENCOUNTER — Inpatient Hospital Stay
Admission: RE | Admit: 2022-12-11 | Discharge: 2022-12-11 | Disposition: A | Discharge status: Home / Self Care | Payer: Medicare Other | Source: Ambulatory Visit | Attending: Cardiovascular Disease | Admitting: Cardiovascular Disease

## 2022-12-11 DIAGNOSIS — R002 Palpitations: Secondary | ICD-10-CM | POA: Insufficient documentation

## 2023-01-12 DIAGNOSIS — R002 Palpitations: Secondary | ICD-10-CM

## 2023-01-12 LAB — 7 DAY EXTENDED HOLTER MONITOR
Heart rate (average): 75 {beats}/min
Isolated SVE count: 721 episodes
Isolated VE Counts: 125 episodes
Longest supraventricular tachycardia episode - duration: 2.5 s
Longest supraventricular tachycardia episode - heart rate (: 111 {beats}/min
Longest supraventricular tachycardia episode - number of be: 5 beats
SVE Couplets Counts: 7 episodes
SVE Triplets Counts: 7 episodes
Supraventricular tachycardia - heart rate (average): 111 {beats}/min
Supraventricular tachycardia - number of episodes: 1
Supraventricular tachycardia with fastest heart rate - dura: 2.5 s
Supraventricular tachycardia with fastest heart rate - hear: 111 {beats}/min
Supraventricular tachycardia with fastest heart rate - numb: 5 beats

## 2023-03-11 ENCOUNTER — Emergency Department (HOSPITAL_COMMUNITY): Payer: Medicare Other

## 2023-03-11 ENCOUNTER — Inpatient Hospital Stay
Admission: EM | Admit: 2023-03-11 | Discharge: 2023-03-13 | DRG: 392 | Disposition: A | Discharge status: Home / Self Care | Payer: Medicare Other | Attending: INTERNAL MEDICINE | Admitting: INTERNAL MEDICINE

## 2023-03-11 ENCOUNTER — Encounter (HOSPITAL_COMMUNITY): Payer: Self-pay | Admitting: CARDIOVASCULAR DISEASE

## 2023-03-11 ENCOUNTER — Other Ambulatory Visit: Payer: Self-pay

## 2023-03-11 DIAGNOSIS — E860 Dehydration: Secondary | ICD-10-CM | POA: Diagnosis present

## 2023-03-11 DIAGNOSIS — A059 Bacterial foodborne intoxication, unspecified: Secondary | ICD-10-CM | POA: Diagnosis present

## 2023-03-11 DIAGNOSIS — Z1152 Encounter for screening for COVID-19: Secondary | ICD-10-CM

## 2023-03-11 DIAGNOSIS — J9601 Acute respiratory failure with hypoxia: Secondary | ICD-10-CM | POA: Insufficient documentation

## 2023-03-11 DIAGNOSIS — I4711 Inappropriate sinus tachycardia, so stated (CMS HCC): Secondary | ICD-10-CM

## 2023-03-11 DIAGNOSIS — R0600 Dyspnea, unspecified: Secondary | ICD-10-CM

## 2023-03-11 DIAGNOSIS — R112 Nausea with vomiting, unspecified: Secondary | ICD-10-CM | POA: Diagnosis present

## 2023-03-11 DIAGNOSIS — K529 Noninfective gastroenteritis and colitis, unspecified: Principal | ICD-10-CM | POA: Diagnosis present

## 2023-03-11 DIAGNOSIS — I341 Nonrheumatic mitral (valve) prolapse: Secondary | ICD-10-CM | POA: Diagnosis present

## 2023-03-11 DIAGNOSIS — R251 Tremor, unspecified: Secondary | ICD-10-CM | POA: Insufficient documentation

## 2023-03-11 DIAGNOSIS — I10 Essential (primary) hypertension: Secondary | ICD-10-CM | POA: Diagnosis present

## 2023-03-11 DIAGNOSIS — J9811 Atelectasis: Secondary | ICD-10-CM | POA: Diagnosis present

## 2023-03-11 DIAGNOSIS — R1110 Vomiting, unspecified: Secondary | ICD-10-CM

## 2023-03-11 DIAGNOSIS — F32A Depression, unspecified: Secondary | ICD-10-CM | POA: Diagnosis present

## 2023-03-11 DIAGNOSIS — J9600 Acute respiratory failure, unspecified whether with hypoxia or hypercapnia: Secondary | ICD-10-CM

## 2023-03-11 DIAGNOSIS — R0902 Hypoxemia: Secondary | ICD-10-CM | POA: Diagnosis present

## 2023-03-11 DIAGNOSIS — Z79899 Other long term (current) drug therapy: Secondary | ICD-10-CM

## 2023-03-11 DIAGNOSIS — F419 Anxiety disorder, unspecified: Secondary | ICD-10-CM | POA: Diagnosis present

## 2023-03-11 DIAGNOSIS — R000 Tachycardia, unspecified: Secondary | ICD-10-CM | POA: Diagnosis present

## 2023-03-11 LAB — ECG 12 LEAD
Atrial Rate: 108 {beats}/min
Atrial Rate: 109 {beats}/min
Atrial Rate: 132 {beats}/min
Calculated P Axis: 40 degrees
Calculated P Axis: 44 degrees
Calculated P Axis: 52 degrees
Calculated R Axis: 26 degrees
Calculated R Axis: 13 degrees
Calculated R Axis: 27 degrees
Calculated T Axis: 11 degrees
Calculated T Axis: 9 degrees
Calculated T Axis: 25 degrees
PR Interval: 184 ms
PR Interval: 206 ms
PR Interval: 164 ms
QRS Duration: 82 ms
QRS Duration: 76 ms
QRS Duration: 70 ms
QT Interval: 340 ms
QT Interval: 326 ms
QT Interval: 334 ms
QTC Calculation: 453 ms
QTC Calculation: 439 ms
QTC Calculation: 494 ms
Ventricular rate: 107 {beats}/min
Ventricular rate: 109 {beats}/min
Ventricular rate: 132 {beats}/min

## 2023-03-11 LAB — COMPREHENSIVE METABOLIC PANEL, NON-FASTING
ALBUMIN/GLOBULIN RATIO: 1.6 — ABNORMAL HIGH (ref 0.8–1.4)
ALBUMIN: 4.4 g/dL (ref 3.5–5.7)
ALKALINE PHOSPHATASE: 61 U/L (ref 34–104)
ALT (SGPT): 45 U/L (ref 7–52)
ANION GAP: 11 mmol/L (ref 4–13)
AST (SGOT): 34 U/L (ref 13–39)
BILIRUBIN TOTAL: 0.7 mg/dL (ref 0.3–1.2)
BUN/CREA RATIO: 20 (ref 6–22)
BUN: 15 mg/dL (ref 7–25)
CALCIUM, CORRECTED: 9.3 mg/dL (ref 8.9–10.8)
CALCIUM: 9.6 mg/dL (ref 8.6–10.3)
CHLORIDE: 107 mmol/L (ref 98–107)
CO2 TOTAL: 24 mmol/L (ref 21–31)
CREATININE: 0.75 mg/dL (ref 0.60–1.30)
ESTIMATED GFR: 91 mL/min/{1.73_m2} (ref >59)
GLOBULIN: 2.8 — ABNORMAL LOW (ref 2.9–5.4)
GLUCOSE: 113 mg/dL — ABNORMAL HIGH (ref 74–109)
OSMOLALITY, CALCULATED: 285 mOsm/kg (ref 270–290)
POTASSIUM: 3.6 mmol/L (ref 3.5–5.1)
PROTEIN TOTAL: 7.2 g/dL (ref 6.4–8.9)
SODIUM: 142 mmol/L (ref 136–145)

## 2023-03-11 LAB — ARTERIAL BLOOD GAS/LACTATE
%FIO2 (ARTERIAL): 21 %
BASE EXCESS (ARTERIAL): 0.4 mmol/L (ref 0.0–2.0)
BICARBONATE (ARTERIAL): 24.4 mmol/L (ref 20.0–26.0)
CARBOXYHEMOGLOBIN: 1.9 % — ABNORMAL HIGH (ref <=1.5)
LACTATE: 0.7 mmol/L (ref <=2.0)
MET-HEMOGLOBIN: 0.3 % (ref <=2.0)
O2CT: 17.8 %
OXYHEMOGLOBIN: 86.4 % — ABNORMAL LOW (ref 88.0–100.0)
PCO2 (ARTERIAL): 42 mm/Hg (ref 35–45)
PH (ARTERIAL): 7.39 (ref 7.35–7.45)
PO2 (ARTERIAL): 53 mm/Hg — ABNORMAL LOW (ref 80–100)

## 2023-03-11 LAB — CBC WITH DIFF
BASOPHIL #: 0 10*3/uL (ref 0.00–0.10)
BASOPHIL %: 0 % (ref 0–1)
EOSINOPHIL #: 0 10*3/uL (ref 0.00–0.50)
EOSINOPHIL %: 1 %
HCT: 44.6 % — ABNORMAL HIGH (ref 31.2–41.9)
HGB: 15.4 g/dL — ABNORMAL HIGH (ref 10.9–14.3)
LYMPHOCYTE #: 1.4 10*3/uL (ref 1.00–3.00)
LYMPHOCYTE %: 21 % (ref 16–44)
MCH: 32.3 pg (ref 24.7–32.8)
MCHC: 34.6 g/dL (ref 32.3–35.6)
MCV: 93.3 fL (ref 75.5–95.3)
MONOCYTE #: 0.3 10*3/uL (ref 0.30–1.00)
MONOCYTE %: 5 % (ref 5–13)
MPV: 9.3 fL (ref 7.9–10.8)
NEUTROPHIL #: 4.9 10*3/uL (ref 1.85–7.80)
NEUTROPHIL %: 73 % (ref 43–77)
PLATELETS: 229 10*3/uL (ref 140–440)
RBC: 4.78 10*6/uL (ref 3.63–4.92)
RDW: 12.4 % (ref 12.3–17.7)
WBC: 6.7 10*3/uL (ref 3.8–11.8)

## 2023-03-11 LAB — PHOSPHORUS: PHOSPHORUS: 2.2 mg/dL — ABNORMAL LOW (ref 3.7–7.2)

## 2023-03-11 LAB — TROPONIN-I
TROPONIN I: 5 ng/L (ref <15)
TROPONIN I: 11 ng/L (ref <15)
TROPONIN I: 16 ng/L — ABNORMAL HIGH (ref <15)

## 2023-03-11 LAB — COVID-19, FLU A/B, RSV RAPID BY PCR
INFLUENZA VIRUS TYPE A: NOT DETECTED
INFLUENZA VIRUS TYPE B: NOT DETECTED
RESPIRATORY SYNCTIAL VIRUS (RSV): NOT DETECTED
SARS-CoV-2: NOT DETECTED

## 2023-03-11 LAB — GOLD TOP TUBE

## 2023-03-11 LAB — THYROID STIMULATING HORMONE (SENSITIVE TSH): TSH: 1.067 u[IU]/mL (ref 0.450–5.330)

## 2023-03-11 LAB — GRAY TOP TUBE

## 2023-03-11 LAB — BLUE TOP TUBE

## 2023-03-11 LAB — MAGNESIUM: MAGNESIUM: 1.9 mg/dL (ref 1.9–2.7)

## 2023-03-11 LAB — CREATINE KINASE (CK), TOTAL, SERUM OR PLASMA: CREATINE KINASE: 61 U/L (ref 30–223)

## 2023-03-11 MED ORDER — SUMATRIPTAN 6 MG/0.5 ML SUBCUTANEOUS SOLUTION
6.0000 mg | Freq: Once | SUBCUTANEOUS | Status: AC
Start: 2023-03-11 — End: 2023-03-11
  Administered 2023-03-11: 6 mg via SUBCUTANEOUS

## 2023-03-11 MED ORDER — METOPROLOL SUCCINATE ER 25 MG TABLET,EXTENDED RELEASE 24 HR
25.0000 mg | ORAL_TABLET | Freq: Every day | ORAL | Status: DC
Start: 2023-03-11 — End: 2023-03-13
  Administered 2023-03-11 – 2023-03-13 (×3): 25 mg via ORAL
  Filled 2023-03-11 (×2): qty 1

## 2023-03-11 MED ORDER — FAMOTIDINE 20 MG TABLET
40.0000 mg | ORAL_TABLET | Freq: Two times a day (BID) | ORAL | Status: DC
Start: 2023-03-11 — End: 2023-03-13
  Administered 2023-03-11 – 2023-03-13 (×4): 40 mg via ORAL
  Filled 2023-03-11 (×3): qty 2

## 2023-03-11 MED ORDER — ONDANSETRON HCL (PF) 4 MG/2 ML INJECTION SOLUTION
4.0000 mg | INTRAMUSCULAR | Status: AC
Start: 2023-03-11 — End: 2023-03-11
  Administered 2023-03-11: 4 mg via INTRAVENOUS

## 2023-03-11 MED ORDER — PANTOPRAZOLE 40 MG TABLET,DELAYED RELEASE
40.0000 mg | DELAYED_RELEASE_TABLET | Freq: Every day | ORAL | Status: DC
Start: 2023-03-11 — End: 2023-03-13
  Administered 2023-03-11 – 2023-03-13 (×3): 40 mg via ORAL
  Filled 2023-03-11 (×2): qty 1

## 2023-03-11 MED ORDER — SODIUM CHLORIDE 0.9 % INTRAVENOUS PIGGYBACK
2.0000 g | Freq: Once | INTRAVENOUS | Status: AC
Start: 2023-03-11 — End: 2023-03-11
  Administered 2023-03-11: 2 g via INTRAVENOUS
  Administered 2023-03-11: 0 g via INTRAVENOUS

## 2023-03-11 MED ORDER — VENLAFAXINE ER 75 MG CAPSULE,EXTENDED RELEASE 24 HR
75.0000 mg | ORAL_CAPSULE | Freq: Every day | ORAL | Status: DC
Start: 2023-03-11 — End: 2023-03-13
  Administered 2023-03-11 – 2023-03-13 (×3): 75 mg via ORAL
  Filled 2023-03-11 (×5): qty 1

## 2023-03-11 MED ORDER — FAMOTIDINE 20 MG TABLET
ORAL_TABLET | ORAL | Status: AC
Start: 2023-03-11 — End: 2023-03-11
  Filled 2023-03-11: qty 2

## 2023-03-11 MED ORDER — SODIUM CHLORIDE 0.9 % IV BOLUS
1000.0000 mL | INJECTION | Status: AC
Start: 2023-03-11 — End: 2023-03-11
  Administered 2023-03-11: 0 mL via INTRAVENOUS
  Administered 2023-03-11: 1000 mL via INTRAVENOUS

## 2023-03-11 MED ORDER — KETOROLAC 30 MG/ML (1 ML) INJECTION SOLUTION
INTRAMUSCULAR | Status: AC
Start: 2023-03-11 — End: 2023-03-11
  Filled 2023-03-11: qty 1

## 2023-03-11 MED ORDER — METOPROLOL SUCCINATE ER 25 MG TABLET,EXTENDED RELEASE 24 HR
ORAL_TABLET | ORAL | Status: AC
Start: 2023-03-11 — End: 2023-03-11
  Filled 2023-03-11: qty 1

## 2023-03-11 MED ORDER — ONDANSETRON HCL (PF) 4 MG/2 ML INJECTION SOLUTION
INTRAMUSCULAR | Status: AC
Start: 2023-03-11 — End: 2023-03-11
  Filled 2023-03-11: qty 2

## 2023-03-11 MED ORDER — SODIUM CHLORIDE 0.9 % INTRAVENOUS PIGGYBACK
INJECTION | INTRAVENOUS | Status: AC
Start: 2023-03-11 — End: 2023-03-11
  Filled 2023-03-11: qty 50

## 2023-03-11 MED ORDER — LOSARTAN 50 MG TABLET
ORAL_TABLET | ORAL | Status: AC
Start: 2023-03-11 — End: 2023-03-11
  Filled 2023-03-11: qty 1

## 2023-03-11 MED ORDER — PANTOPRAZOLE 40 MG TABLET,DELAYED RELEASE
DELAYED_RELEASE_TABLET | ORAL | Status: AC
Start: 2023-03-11 — End: 2023-03-11
  Filled 2023-03-11: qty 1

## 2023-03-11 MED ORDER — SUMATRIPTAN 6 MG/0.5 ML SUBCUTANEOUS SOLUTION
SUBCUTANEOUS | Status: AC
Start: 2023-03-11 — End: 2023-03-11
  Filled 2023-03-11: qty 0.5

## 2023-03-11 MED ORDER — KETOROLAC 30 MG/ML (1 ML) INJECTION SOLUTION
15.0000 mg | Freq: Once | INTRAMUSCULAR | Status: AC
Start: 2023-03-11 — End: 2023-03-11
  Administered 2023-03-11: 15 mg via INTRAVENOUS

## 2023-03-11 MED ORDER — ENOXAPARIN 40 MG/0.4 ML SUBCUTANEOUS SYRINGE
INJECTION | SUBCUTANEOUS | Status: AC
Start: 2023-03-11 — End: 2023-03-11
  Filled 2023-03-11: qty 0.4

## 2023-03-11 MED ORDER — LOSARTAN 50 MG TABLET
50.0000 mg | ORAL_TABLET | Freq: Every day | ORAL | Status: DC
Start: 2023-03-11 — End: 2023-03-13
  Administered 2023-03-11 – 2023-03-13 (×3): 50 mg via ORAL
  Filled 2023-03-11 (×2): qty 1

## 2023-03-11 MED ORDER — IOHEXOL 350 MG IODINE/ML INTRAVENOUS SOLUTION
75.0000 mL | INTRAVENOUS | Status: AC
Start: 2023-03-11 — End: 2023-03-11
  Administered 2023-03-11: 75 mL via INTRAVENOUS

## 2023-03-11 MED ORDER — CEFTRIAXONE 2 GRAM SOLUTION FOR INJECTION
INTRAMUSCULAR | Status: AC
Start: 2023-03-11 — End: 2023-03-11
  Filled 2023-03-11: qty 20

## 2023-03-11 MED ORDER — ENOXAPARIN 40 MG/0.4 ML SUBCUTANEOUS SYRINGE
40.0000 mg | INJECTION | SUBCUTANEOUS | Status: DC
Start: 2023-03-11 — End: 2023-03-13
  Administered 2023-03-11 – 2023-03-12 (×2): 40 mg via SUBCUTANEOUS
  Filled 2023-03-11: qty 0.4

## 2023-03-11 NOTE — ED Triage Notes (Signed)
C/O NAUSEA AND CHILLS. STATES HAD A TOOTH EXTRACTION FRIDAY AND STOPPED TAKING HER ABT.

## 2023-03-11 NOTE — Care Plan (Signed)
Problem: Adult Inpatient Plan of Care  Goal: Plan of Care Review  Outcome: Ongoing (see interventions/notes)  Goal: Patient-Specific Goal (Individualized)  Outcome: Ongoing (see interventions/notes)  Flowsheets (Taken 03/11/2023 2300)  Individualized Care Needs: O2 management  Anxieties, Fears or Concerns: Concerned about what's causing her to be sick.  Patient-Specific Goals (Include Timeframe): Get home soon as possible  Goal: Absence of Hospital-Acquired Illness or Injury  Outcome: Ongoing (see interventions/notes)  Intervention: Identify and Manage Fall Risk  Recent Flowsheet Documentation  Taken 03/11/2023 2319 by Rico Junker, RN  Safety Promotion/Fall Prevention: activity supervised  Goal: Optimal Comfort and Wellbeing  Outcome: Ongoing (see interventions/notes)  Goal: Rounds/Family Conference  Outcome: Ongoing (see interventions/notes)     Problem: Health Knowledge, Opportunity to Enhance (Adult,Obstetrics,Pediatric)  Goal: Knowledgeable about Health Subject/Topic  Description: Patient will demonstrate the desired outcomes by discharge/transition of care.  Outcome: Ongoing (see interventions/notes)     Problem: Nausea and Vomiting  Goal: Nausea and Vomiting Relief  Outcome: Ongoing (see interventions/notes)

## 2023-03-11 NOTE — ED Nurses Note (Signed)
Report called to [place] on [facility].

## 2023-03-11 NOTE — ED Provider Notes (Addendum)
Emergency Medicine    Name: Yesenia Walker  Age and Gender: 62 y.o. female  Date of Birth: 11-17-1961  MRN: [id number]  PCP: Mariah Milling, DO    CC:  Chief Complaint   Patient presents with    Nausea       HPI:  Yesenia Walker is a 61 y.o. White female with history of nausea and vomiting yesterday following dental extraction two days ago. She stopped her antibiotic due to nausea.      She also notes diffuse tremor for one year. She sometimes has migraines but nothing new. She passed out once about two months ago.  She states the general tremor comes and goes in intensity and sometimes she can't walk.    She reports having palpitations at times.    She denies fever, cough, chest pain, difficulty swallowing or abdominal pain. She does report some occasional dyspnea.      There is no family history of Parkinson's disease.      [employer] Pain Rating Scale     On a scale of 0-10, during the past 24 hours, pain has interfered with you usual activity:       On a scale of 0-10, during the past 24 hours, pain has interfered with your sleep:      On a scale of 0-10, during the past 24 hours, pain has affected your mood:       On a scale of 0-10, during the past 24 hours, pain has contributed to your stress:       On a scale of 0-10, what is your overall pain Rating: 0        Below pertinent information reviewed with patient:  Past Medical History:   Diagnosis Date    Depression     Enterostenosis (CMS HCC)     HTN (hypertension)     Migraines     Mitral valve prolapse            Allergies   Allergen Reactions    Ultram [Tramadol] Itching       Past Surgical History:   Procedure Laterality Date    BREAST MASS EXCISION      HX BREAST BIOPSY Right     NEGATIVE    HX TUBAL LIGATION      INCONTINENCE SURGERY      SHOULDER SURGERY             Social History        Objective:    ED Triage Vitals [03/11/23 0912]   BP (Non-Invasive) (!) 169/93   Heart Rate (!) 104   Respiratory Rate 18   Temperature 36.6 C (97.8 F)   SpO2 97 %    Weight 86.2 kg (190 lb)   Height 1.6 m (5\' 3" )     Filed Vitals:    03/11/23 1130 03/11/23 1145 03/11/23 1200 03/11/23 1215   BP: (!) 161/92 138/79 131/82 136/81   Pulse: (!) 119 (!) 120 (!) 114 (!) 116   Resp: 20 19 19 15    Temp:       SpO2: 95% 93% 92% 96%       Nursing notes and vital signs reviewed.    Constitutional - No acute distress.  Alert and Active.  HEENT - Normocephalic. Atraumatic. PERRL. EOMI. Conjunctiva clear. Oropharynx with no erythema, lesions, or exudates. Moist mucous membranes.   Neck - Trachea midline. No stridor. No hoarseness.  Cardiac - Tachycardia, regular No murmurs, rubs, or gallops.  Respiratory - Clear to auscultation bilaterally. No rales, wheezes or rhonchi.  Abdomen - Non-tender, soft, non-distended. No rebound or guarding.   Musculoskeletal - Good AROM. No muscle or joint tenderness appreciated. No clubbing, cyanosis or edema.  Skin - Warm and dry, without any rashes or other lesions.  Neuro - Cranial nerves II-XII are grossly intact.  Moving all extremities symmetrically. Mild generalized tremor.    Any pertinent labs and imaging obtained during this encounter reviewed below in MDM.    MDM/ED Course:    EKG shows a sinus tachycardia, rate of 107. Normal PR and QRS. No STEMI.  Inferior ST depression/T wave inversions.      Patient is noted to be hypoxic with SaO2 in the 80's.  ABG on room air was 53.      Repeat EKG shows a sinus tachycardia, rate of 113. Normal PR and QRS. No STEMI.    CTA negative. Her tremor has mostly resolved.     I have contacted Dr. Stefan Church, hospitalist, to admit this patient as I do not know the etiology of her hypoxemia.    Medical Decision Making  Amount and/or Complexity of Data Reviewed  Labs: ordered. Decision-making details documented in ED Course.  Radiology: ordered. Decision-making details documented in ED Course.  ECG/medicine tests: ordered. Decision-making details documented in ED Course.    Risk  Prescription drug management.  Decision  regarding hospitalization.             Orders Placed This Encounter    XR AP MOBILE CHEST    CT ANGIO CHEST W IV CONTRAST    CBC/DIFF    COMPREHENSIVE METABOLIC PANEL, NON-FASTING    THYROID STIMULATING HORMONE (SENSITIVE TSH)    MAGNESIUM    PHOSPHORUS    CREATINE KINASE (CK), TOTAL, SERUM OR PLASMA    CBC WITH DIFF    EXTRA TUBES    BLUE TOP TUBE    GOLD TOP TUBE    GRAY TOP TUBE    ARTERIAL BLOOD GAS/LACTATE    TROPONIN NOW    TROPONIN IN ONE HOUR    TROPONIN IN THREE HOURS    ECG 12 LEAD    ECG 12 LEAD    NS bolus infusion 1,000 mL    ondansetron (ZOFRAN) 2 mg/mL injection    iohexol (OMNIPAQUE 350) infusion    NS bolus infusion 1,000 mL         Impression:   Clinical Impression   Tremor (Primary)   Vomiting, unspecified vomiting type, unspecified whether nausea present   Dyspnea, unspecified type   Hypoxemia       Disposition: Admitted          Portions of this note may have been dictated using voice recognition software.     Cain Saupe, MD  [facility] ED    -----------------------  Results for orders placed or performed during the hospital encounter of 03/11/23 (from the past 12 hour(s))   COMPREHENSIVE METABOLIC PANEL, NON-FASTING   Result Value Ref Range    SODIUM 142 136 - 145 mmol/L    POTASSIUM 3.6 3.5 - 5.1 mmol/L    CHLORIDE 107 98 - 107 mmol/L    CO2 TOTAL 24 21 - 31 mmol/L    ANION GAP 11 4 - 13 mmol/L    BUN 15 7 - 25 mg/dL    CREATININE 1.75 1.02 - 1.30 mg/dL    BUN/CREA RATIO 20 6 - 22    ESTIMATED GFR 91 >59 mL/min/1.72m^2  ALBUMIN 4.4 3.5 - 5.7 g/dL    CALCIUM 9.6 8.6 - 16.110.3 mg/dL    GLUCOSE 096113 (H) 74 - 109 mg/dL    ALKALINE PHOSPHATASE 61 34 - 104 U/L    ALT (SGPT) 45 7 - 52 U/L    AST (SGOT) 34 13 - 39 U/L    BILIRUBIN TOTAL 0.7 0.3 - 1.2 mg/dL    PROTEIN TOTAL 7.2 6.4 - 8.9 g/dL    ALBUMIN/GLOBULIN RATIO 1.6 (H) 0.8 - 1.4    OSMOLALITY, CALCULATED 285 270 - 290 mOsm/kg    CALCIUM, CORRECTED 9.3 8.9 - 10.8 mg/dL    GLOBULIN 2.8 (L) 2.9 - 5.4   THYROID STIMULATING HORMONE  (SENSITIVE TSH)   Result Value Ref Range    TSH 1.067 0.450 - 5.330 uIU/mL   MAGNESIUM   Result Value Ref Range    MAGNESIUM 1.9 1.9 - 2.7 mg/dL   PHOSPHORUS   Result Value Ref Range    PHOSPHORUS 2.2 (L) 3.7 - 7.2 mg/dL   CREATINE KINASE (CK), TOTAL, SERUM OR PLASMA   Result Value Ref Range    CREATINE KINASE 61 30 - 223 U/L   CBC WITH DIFF   Result Value Ref Range    WBC 6.7 3.8 - 11.8 x10^3/uL    RBC 4.78 3.63 - 4.92 x10^6/uL    HGB 15.4 (H) 10.9 - 14.3 g/dL    HCT 04.544.6 (H) 40.931.2 - 41.9 %    MCV 93.3 75.5 - 95.3 fL    MCH 32.3 24.7 - 32.8 pg    MCHC 34.6 32.3 - 35.6 g/dL    RDW 81.112.4 91.412.3 - 78.217.7 %    PLATELETS 229 140 - 440 x10^3/uL    MPV 9.3 7.9 - 10.8 fL    NEUTROPHIL % 73 43 - 77 %    LYMPHOCYTE % 21 16 - 44 %    MONOCYTE % 5 5 - 13 %    EOSINOPHIL % 1 %    BASOPHIL % 0 0 - 1 %    NEUTROPHIL # 4.90 1.85 - 7.80 x10^3/uL    LYMPHOCYTE # 1.40 1.00 - 3.00 x10^3/uL    MONOCYTE # 0.30 0.30 - 1.00 x10^3/uL    EOSINOPHIL # 0.00 0.00 - 0.50 x10^3/uL    BASOPHIL # 0.00 0.00 - 0.10 x10^3/uL   TROPONIN NOW   Result Value Ref Range    TROPONIN I 5 <15 ng/L   BLUE TOP TUBE   Result Value Ref Range    RAINBOW/EXTRA TUBE AUTO RESULT Yes    GRAY TOP TUBE   Result Value Ref Range    RAINBOW/EXTRA TUBE AUTO RESULT Yes    ARTERIAL BLOOD GAS/LACTATE   Result Value Ref Range    PH (ARTERIAL) 7.39 7.35 - 7.45    PCO2 (ARTERIAL) 42 35 - 45 mm/Hg    BICARBONATE (ARTERIAL) 24.4 20.0 - 26.0 mmol/L    BASE EXCESS (ARTERIAL) 0.4 0.0 - 2.0 mmol/L    MET-HEMOGLOBIN 0.3 <=2.0 %    LACTATE 0.7 <=2.0 mmol/L    CARBOXYHEMOGLOBIN 1.9 (H) <=1.5 %    O2CT 17.8 %    %FIO2 (ARTERIAL) 21 %    PO2 (ARTERIAL) 53 (L) 80 - 100 mm/Hg    OXYHEMOGLOBIN 86.4 (L) 88.0 - 100.0 %    ALLEN TEST yes     DRAW SITE RR    TROPONIN IN ONE HOUR   Result Value Ref Range    TROPONIN I 11 <15 ng/L  CT ANGIO CHEST W IV CONTRAST   Final Result   1. NO PULMONARY EMBOLUS   2. THERE IS SOME DEPENDENT ATELECTASIS IN THE LUNGS WITH NO OTHER ACUTE FINDINGS         One or more  dose reduction techniques were used (e.g., Automated exposure control, adjustment of the mA and/or kV according to patient size, use of iterative reconstruction technique).         Radiologist location ID: [id number]         XR AP MOBILE CHEST   Final Result   NO ACUTE FINDINGS.            Radiologist location ID: [id number]

## 2023-03-11 NOTE — H&P (Signed)
[facility] Medicine  Admission H&P    Date of Service:  03/11/2023 (autofill to the current date)  Yesenia Walker, Yesenia Walker, 62 y.o. female  Date of Admission:  03/11/2023  Date of Birth:  1961-07-14  PCP: Mariah Milling, DO          Information Obtained from: patient  Chief Complaint:  Nausea vomiting (**delete)    HPI: Yesenia Walker is a 62 y.o., female past medical history of depression, hypertension, mitral valve prolapse, migraine.  Patient presented with nausea vomiting, shaking episodes/rigors/chills, she denies any fever, denies any food intake from outside likely some gastroenteritis concern.  Tachycardia cause could be secondary to the mitral valve prolapse also.  CT PE was done that was negative for pulmonary embolism.  Patient is clinically stable we will admit as an observation    PAST MEDICAL:    Past Medical History:   Diagnosis Date    Depression     Enterostenosis (CMS HCC)     HTN (hypertension)     Migraines     Mitral valve prolapse         Past Surgical History:   Procedure Laterality Date    BREAST MASS EXCISION      HX BREAST BIOPSY Right     NEGATIVE    HX TUBAL LIGATION      INCONTINENCE SURGERY      SHOULDER SURGERY              MEDS:  Medications Prior to Admission       Prescriptions    estradioL (ESTRACE) 0.01 % (0.1 mg/gram) Vaginal Cream    Insert 1 g into the vagina Every MON and THURS    famotidine (PEPCID) 40 mg Oral Tablet    Take 1 Tablet (40 mg total) by mouth Twice daily    losartan (COZAAR) 50 mg Oral Tablet    Take 1 Tablet (50 mg total) by mouth Once a day    metoprolol succinate (TOPROL-XL) 25 mg Oral Tablet Sustained Release 24 hr    Take 1 Tablet (25 mg total) by mouth Once a day    pantoprazole (PROTONIX) 40 mg Oral Tablet, Delayed Release (E.C.)    Take 1 Tablet (40 mg total) by mouth Once a day    solifenacin (VESICARE) 10 mg Oral Tablet    Take 1 Tablet (10 mg total) by mouth Once a day    sucralfate (CARAFATE) 1 gram Oral Tablet    Take 1 Tablet (1 g total) by mouth Twice a  day before meals    venlafaxine (EFFEXOR XR) 75 mg Oral Capsule, Sust. Release 24 hr    Take 1 Capsule (75 mg total) by mouth Once a day          Allergies   Allergen Reactions    Ultram [Tramadol] Itching       Family History  Family Medical History:       Problem Relation (Age of Onset)    Breast Cancer Maternal Aunt    No Known Problems Mother, Father, Sister, Brother, Maternal Grandmother, Maternal Grandfather, Paternal Grandmother, Paternal Grandfather, Daughter, Son, Maternal Uncle, Paternal Aunt, Paternal Uncle, Other            Social History  Reviewed lives with the husband.    ROS:  Negative except in HPI    Examination:  Temperature: 36.6 C (97.8 F) Heart Rate: (!) 112 BP (Non-Invasive): (!) 143/94   Respiratory Rate: (!) 23 SpO2: 97 %  Gen: No acute distress  Neck: No JVD  Respiratory: clear to auscultation   CVS: Normal S1, S2, No murmur, rub, gallop  Abdomen: non tender, non distended.   Extremities:  No edema    Neuro: Alert oriented to time, place and person      Labs:  Reviewed    (**consolidate labs and imaging to the last 24hours. Optional list)    Imaging Studies:  Reviewed    (Have to include personal interpretation of recent imaging)    **(change to last 24hours of results/imaging)    Consults:  Not applicable    (Have to include which provider and what was discussed)    I have ordered tests related to the inpatient managed diagnoses and problems  (see below for specific orders)    DNR Status:  No Order    Assessment/Plan:   Diagnosis:   Sinus tachycardia, poor p.o. intake, nausea vomiting at presentation with some stomach belching, Acute gastroenteritis and also sinus tachycardia, prior history of mitral valve prolapse.    Hypertension        Plan:   Hemoconcentrated labs, poor p.o. intake, Tachycardia cause could be secondary to the mitral valve prolapse also.  CT PE was done that was negative for pulmonary embolism.  Patient is clinically stable we will admit as an observation and  continue to watch telemetry for any abnormal rhythms.  Gastroenteritis stomach bloating, Diuril episode nausea vomiting, likely food poisoning with sinus tachycardia with dehydration on exam.    Continue IV fluids.    Zofran p.r.n.  Transthoracic echocardiogram to rule evaluate prior history of mitral valve prolapse.    Telemetry   Resume home medication  Full code   Heparin subQ prophylaxis.    I, as the attending physician, have completed the completed the assessment of this patient and my medical decision making process deems that this patient meets appropriate criteria to be admitted to the inpatient hospital for management of the above listed conditions and pathology.    INITIAL H&P LEVEL 1 (TOTAL TIME 40-55 MINUTES) (09811)    On 03/11/2023 I spent a total visit time of 30-45 minutes. Time included review of tests and ordering tests, obtaining/reviewing history, examining the patient, communicating with consultants, documenting clinical information and counseling the patient and/or family regarding the diagnosis and management plan and coordination of care involved services directly related to patient care.    Beverely Pace, MD

## 2023-03-12 ENCOUNTER — Observation Stay (HOSPITAL_BASED_OUTPATIENT_CLINIC_OR_DEPARTMENT_OTHER): Payer: Medicare Other

## 2023-03-12 DIAGNOSIS — R1110 Vomiting, unspecified: Secondary | ICD-10-CM

## 2023-03-12 DIAGNOSIS — F32A Depression, unspecified: Secondary | ICD-10-CM

## 2023-03-12 DIAGNOSIS — F419 Anxiety disorder, unspecified: Secondary | ICD-10-CM

## 2023-03-12 DIAGNOSIS — I10 Essential (primary) hypertension: Secondary | ICD-10-CM

## 2023-03-12 DIAGNOSIS — Z79899 Other long term (current) drug therapy: Secondary | ICD-10-CM

## 2023-03-12 DIAGNOSIS — R0600 Dyspnea, unspecified: Secondary | ICD-10-CM

## 2023-03-12 DIAGNOSIS — K529 Noninfective gastroenteritis and colitis, unspecified: Principal | ICD-10-CM

## 2023-03-12 LAB — CBC WITH DIFF
BASOPHIL #: 0 10*3/uL (ref 0.00–0.10)
BASOPHIL %: 1 % (ref 0–1)
EOSINOPHIL #: 0 10*3/uL (ref 0.00–0.50)
EOSINOPHIL %: 1 %
HCT: 39.9 % (ref 31.2–41.9)
HGB: 13.9 g/dL (ref 10.9–14.3)
LYMPHOCYTE #: 1.3 10*3/uL (ref 1.00–3.00)
LYMPHOCYTE %: 22 % (ref 16–44)
MCH: 32.8 pg (ref 24.7–32.8)
MCHC: 34.8 g/dL (ref 32.3–35.6)
MCV: 94.3 fL (ref 75.5–95.3)
MONOCYTE #: 0.3 10*3/uL (ref 0.30–1.00)
MONOCYTE %: 5 % (ref 5–13)
MPV: 8.9 fL (ref 7.9–10.8)
NEUTROPHIL #: 4.4 10*3/uL (ref 1.85–7.80)
NEUTROPHIL %: 72 % (ref 43–77)
PLATELETS: 217 10*3/uL (ref 140–440)
RBC: 4.24 10*6/uL (ref 3.63–4.92)
RDW: 12.4 % (ref 12.3–17.7)
WBC: 6.1 10*3/uL (ref 3.8–11.8)

## 2023-03-12 LAB — COMPREHENSIVE METABOLIC PANEL, NON-FASTING
ALBUMIN/GLOBULIN RATIO: 1.7 — ABNORMAL HIGH (ref 0.8–1.4)
ALBUMIN: 4 g/dL (ref 3.5–5.7)
ALKALINE PHOSPHATASE: 49 U/L (ref 34–104)
ALT (SGPT): 41 U/L (ref 7–52)
ANION GAP: 5 mmol/L (ref 4–13)
AST (SGOT): 29 U/L (ref 13–39)
BILIRUBIN TOTAL: 0.4 mg/dL (ref 0.3–1.2)
BUN/CREA RATIO: 21 (ref 6–22)
BUN: 16 mg/dL (ref 7–25)
CALCIUM, CORRECTED: 9 mg/dL (ref 8.9–10.8)
CALCIUM: 9 mg/dL (ref 8.6–10.3)
CHLORIDE: 110 mmol/L — ABNORMAL HIGH (ref 98–107)
CO2 TOTAL: 28 mmol/L (ref 21–31)
CREATININE: 0.76 mg/dL (ref 0.60–1.30)
ESTIMATED GFR: 89 mL/min/{1.73_m2} (ref >59)
GLOBULIN: 2.4 — ABNORMAL LOW (ref 2.9–5.4)
GLUCOSE: 137 mg/dL — ABNORMAL HIGH (ref 74–109)
OSMOLALITY, CALCULATED: 288 mOsm/kg (ref 270–290)
POTASSIUM: 4 mmol/L (ref 3.5–5.1)
PROTEIN TOTAL: 6.4 g/dL (ref 6.4–8.9)
SODIUM: 143 mmol/L (ref 136–145)

## 2023-03-12 LAB — MAGNESIUM: MAGNESIUM: 1.9 mg/dL (ref 1.9–2.7)

## 2023-03-12 LAB — THYROXINE, TOTAL T4: T4 TOTAL: 7.2 ug/dL (ref 6.1–12.2)

## 2023-03-12 LAB — T3 (TRIIODOTHYRONINE), FREE, SERUM: T3 FREE: 2.8 pg/mL (ref 1.7–3.7)

## 2023-03-12 MED ORDER — ACETAMINOPHEN 325 MG TABLET
650.0000 mg | ORAL_TABLET | ORAL | Status: DC | PRN
Start: 2023-03-12 — End: 2023-03-13
  Administered 2023-03-12: 650 mg via ORAL
  Filled 2023-03-12: qty 2

## 2023-03-12 MED ORDER — SODIUM CHLORIDE 0.9 % INTRAVENOUS PIGGYBACK
1.0000 g | INTRAVENOUS | Status: DC
Start: 2023-03-12 — End: 2023-03-13
  Administered 2023-03-12: 1 g via INTRAVENOUS
  Administered 2023-03-12 – 2023-03-13 (×2): 0 g via INTRAVENOUS
  Filled 2023-03-12: qty 10

## 2023-03-12 NOTE — Nurses Notes (Signed)
Informed provider of patient's request for headache medication.

## 2023-03-12 NOTE — Respiratory Therapy (Signed)
Took patient off Oxygen for overnight pulse ox.

## 2023-03-12 NOTE — Progress Notes (Signed)
[place] MEDICINE [facility]    HOSPITALIST PROGRESS NOTE    Yesenia Walker  Date of service: 03/12/2023  Date of Admission:  03/11/2023  Hospital Day:  LOS: 0 days     Subjective:   62 year old patient with history of depression, HTN, Migraines, MVP.  Seen in follow up today for suspected   Gastroenteritis.    A focused review of symptoms was performed and is negative unless specified in HPI/subjective.    Vital Signs:  Filed Vitals:    03/12/23 0731 03/12/23 0750 03/12/23 1008 03/12/23 1549   BP: 116/64   116/69   Pulse: 72  78 69   Resp: 18   20   Temp: 36.7 C (98.1 F)   37.3 C (99.2 F)   SpO2: 95% 95%  94%        Physical Exam:  GENERAL: No acute distress, Resting comfortably, Alert and oriented x3    SKIN: warm and dry.    CARDIAC: S1 and S2, no murmurs, clicks, or gallops. No S3.    LUNGS: Clear to auscultation bilaterally. No wheezes or rhonchi. No accessory muscle use noted.    GI: Abdomen soft, nontender. Good bowel sounds x 4. No organomegaly appreciated.    GU: No suprapubic tenderness. No costovertebral tenderness.    EXTREMITIES: No pedal edema noted. Distal pulses equal bilaterally.    NEUROLOGICAL: Cranial nerves grossly intact. No gross focal deficit.     Current Medications:  cefTRIAXone (ROCEPHIN) 1 g in NS 50 mL IVPB minibag, 1 g, Intravenous, Q24H  enoxaparin PF (LOVENOX) 40 mg/0.4 mL SubQ injection, 40 mg, Subcutaneous, Q24H  famotidine (PEPCID) tablet, 40 mg, Oral, 2x/day  losartan (COZAAR) tablet, 50 mg, Oral, Daily  metoprolol succinate (TOPROL-XL) 24 hr extended release tablet, 25 mg, Oral, Daily  pantoprazole (PROTONIX) delayed release tablet, 40 mg, Oral, Daily  venlafaxine (EFFEXOR XR) 24 hr extended release capsule, 75 mg, Oral, Daily        Labs:  CBC:     6.1 (04/08 0935) \   13.9 (04/08 0935) /   217 (04/08 0935)      / 39.9 (04/08 0935) \          BMP:   143 (04/08 0935) 110* (04/08 0935) 16 (04/08 0935)    /     137* (04/08 0935)   4.0 (04/08 0935) 28 (04/08 0935)  0.76 (04/08 0935) \              Recent Results (from the past 24 hour(s))   COMPREHENSIVE METABOLIC PANEL, NON-FASTING    Collection Time: 03/12/23  9:35 AM   Result Value    ALKALINE PHOSPHATASE 49    ALT (SGPT) 41    AST (SGOT) 29          Radiology:  CT ANGIO CHEST W IV CONTRAST    Result Date: 03/11/2023  Impression 1. NO PULMONARY EMBOLUS 2. THERE IS SOME DEPENDENT ATELECTASIS IN THE LUNGS WITH NO OTHER ACUTE FINDINGS One or more dose reduction techniques were used (e.g., Automated exposure control, adjustment of the mA and/or kV according to patient size, use of iterative reconstruction technique). Radiologist location ID: [id number]     XR AP MOBILE CHEST    Result Date: 03/11/2023  Impression NO ACUTE FINDINGS. Radiologist location ID: [id number]       The laboratory results, imaging results and other diagnostic results were reviewed in the EMR.    Assessment/ Plan:   Active  Hospital Problems   (*Primary Problem)    Diagnosis    *Gastroenteritis     Suspected acute gastroenteritis  Patient feeling better today.  Denies any vomiting today.  Continue Zofran, IVF, empiric ceftriaxone.   Blood cultures pending.  4plex negative.      Sinus tachycardia   Resolved with hydration.  Likely secondary to illness  EF 70%, normal diastolic function on Echo.  Has MVP  No pulm. HTN.  TSH WNL, free T4 wnl    HTN  Continue home dose of metoprolol and losartan.    Anxiety/depression  Continue Effexor    Will check nocturnal desaturation study secondary to episode of hypoxia.  Patient's oxygen saturation 95% on Room air while awake.     Any further intervention will be determined by hospital course.     MY ORDERS LAST 24 (24h ago, onward)       Start     Ordered    03/12/23 1530  PATIENT CLASS/LEVEL OF CARE CLARIFICATION - PRN  ONE TIME         03/12/23 1527    03/12/23 0730  UPDATE PATIENT - (SERVICE OR ATTENDING) - PRN  ONE TIME         03/12/23 0717                    DVT/PE Prophylaxis: Enoxaparin    Disposition Planning:  Home discharge      On the day of the encounter, a total of  35 minutes was spent on this patient encounter including review of historical information, examination, documentation and post-visit activities. The time documented excludes procedural time.     Linus Orn, DO FACP [facility]  03/12/2023  [place] MEDICINE HOSPITALIST

## 2023-03-13 DIAGNOSIS — Z9981 Dependence on supplemental oxygen: Secondary | ICD-10-CM

## 2023-03-13 DIAGNOSIS — J9601 Acute respiratory failure with hypoxia: Secondary | ICD-10-CM | POA: Insufficient documentation

## 2023-03-13 DIAGNOSIS — R0902 Hypoxemia: Secondary | ICD-10-CM

## 2023-03-13 DIAGNOSIS — R9431 Abnormal electrocardiogram [ECG] [EKG]: Secondary | ICD-10-CM

## 2023-03-13 DIAGNOSIS — R251 Tremor, unspecified: Secondary | ICD-10-CM | POA: Insufficient documentation

## 2023-03-13 DIAGNOSIS — J9600 Acute respiratory failure, unspecified whether with hypoxia or hypercapnia: Secondary | ICD-10-CM | POA: Insufficient documentation

## 2023-03-13 LAB — CBC
HCT: 37.1 % (ref 31.2–41.9)
HGB: 12.9 g/dL (ref 10.9–14.3)
MCH: 32.7 pg (ref 24.7–32.8)
MCHC: 34.8 g/dL (ref 32.3–35.6)
MCV: 94 fL (ref 75.5–95.3)
MPV: 9.4 fL (ref 7.9–10.8)
PLATELETS: 207 10*3/uL (ref 140–440)
RBC: 3.95 10*6/uL (ref 3.63–4.92)
RDW: 12.2 % — ABNORMAL LOW (ref 12.3–17.7)
WBC: 5.6 10*3/uL (ref 3.8–11.8)

## 2023-03-13 LAB — BASIC METABOLIC PANEL
ANION GAP: 4 mmol/L (ref 4–13)
BUN/CREA RATIO: 31 — ABNORMAL HIGH (ref 6–22)
BUN: 19 mg/dL (ref 7–25)
CALCIUM: 8.9 mg/dL (ref 8.6–10.3)
CHLORIDE: 109 mmol/L — ABNORMAL HIGH (ref 98–107)
CO2 TOTAL: 29 mmol/L (ref 21–31)
CREATININE: 0.62 mg/dL (ref 0.60–1.30)
ESTIMATED GFR: 101 mL/min/{1.73_m2} (ref >59)
GLUCOSE: 97 mg/dL (ref 74–109)
OSMOLALITY, CALCULATED: 285 mOsm/kg (ref 270–290)
POTASSIUM: 3.9 mmol/L (ref 3.5–5.1)
SODIUM: 142 mmol/L (ref 136–145)

## 2023-03-13 LAB — MAGNESIUM: MAGNESIUM: 1.9 mg/dL (ref 1.9–2.7)

## 2023-03-13 LAB — ECG 12 LEAD
Atrial Rate: 113 {beats}/min
Calculated P Axis: 40 degrees
Calculated R Axis: 20 degrees
Calculated T Axis: 9 degrees
PR Interval: 192 ms
QRS Duration: 80 ms
QT Interval: 314 ms
QTC Calculation: 430 ms
Ventricular rate: 113 {beats}/min

## 2023-03-13 NOTE — Respiratory Therapy (Signed)
03/13/23 1400   NIF   1st Attempt -20 cmH2O   2nd Attempt -38 cmH2O   3rd Attempt -40 cmH2O   Average NIF -33 cmH2O     NIF performed with good effort and understanding.

## 2023-03-13 NOTE — Nurses Notes (Addendum)
Oxygen saturation 87% on room air at rest. Pt. Will require 2L/NC continuously.

## 2023-03-13 NOTE — Care Management Notes (Signed)
[facility]  Care Management Initial Evaluation    Patient Name: Yesenia Walker  Date of Birth: 1961-03-05  Sex: female  Date/Time of Admission: 03/11/2023  9:14 AM  Room/Bed: 330/B  Payor: MEDICARE / Plan: MEDICARE PART A AND B / Product Type: Medicare /   Primary Care Providers:  Mariah Milling, DO, DO (General)    Pharmacy Info:   Preferred Pharmacy       [facility] - [place], [place] - [address] [place] [address]    [address] [place] [address]    Phone: [phone] Fax: [phone]    Hours: Not open 24 hours    [id number] [place], [place] [facility] - [address] AT [facility] [address] [place] [address]    [address] [place] [address]    Phone: [phone] Fax: [phone]    Hours: Not open 24 hours          Emergency Contact Info:   Extended Emergency Contact Information  Primary Emergency Contact: [facility]  Address: [address]           [place], [place] [address] [place]  Home Phone: [phone]  Work Phone: [phone]  Mobile Phone: [phone]  Relation: Husband    History:   Yesenia Walker is a 61 y.o., female, admitted     Height/Weight: 160 cm (5\' 3" ) / 90.9 kg (200 lb 4.8 oz)     LOS: 1 day   Admitting Diagnosis: Gastroenteritis [K52.9]    Assessment:       Discharge Plan:  Home with DME (code 1)  CM SENT 02 ORDER VIA CAREPORT TO [facility] AND NOTIFIED DANIELLE THAT THE ORDER IS AVAILABLE NOW. CM UPDATED THE PATIENT'S NURSE THAT [facility] HAS THE 02 ORDER NOW.     The patient will continue to be evaluated for developing discharge needs.     Case Manager: Luiz Ochoa, SOCIAL WORKER  Phone: [phone]

## 2023-03-13 NOTE — Respiratory Therapy (Signed)
Placed patient back on 2L NC.Patients sat had been in 80's , sat 87% on 2L sat 94%. Report printed .

## 2023-03-13 NOTE — Care Management Notes (Signed)
[facility]  Care Management Initial Evaluation    Patient Name: Yesenia Walker  Date of Birth: 09-Jan-1961  Sex: female  Date/Time of Admission: 03/11/2023  9:14 AM  Room/Bed: 330/B  Payor: MEDICARE / Plan: MEDICARE PART A AND B / Product Type: Medicare /   Primary Care Providers:  Mariah Milling, DO, DO (General)    Pharmacy Info:   Preferred Pharmacy       [facility] - [place], [place] - [address]    [address] [place] [address]    Phone: [phone] Fax: [phone]    Hours: Not open 24 hours    [id number] [address] [address], [facility] - [address] AT [facility] [address] [place]    [address] [place] [address]    Phone: [phone] Fax: [phone]    Hours: Not open 24 hours          Emergency Contact Info:   Extended Emergency Contact Information  Primary Emergency Contact: [facility]  Address: [address]           [place], [place] [address] [place]  Home Phone: [phone]  Work Phone: [phone]  Mobile Phone: [phone]  Relation: Husband    History:   Kess Fetty is a 62 y.o., female, admitted     Height/Weight: 160 cm (5\' 3" ) / 90.9 kg (200 lb 4.8 oz)     LOS: 1 day   Admitting Diagnosis: Gastroenteritis [K52.9]    Assessment:       Discharge Plan:  Home with DME (code 1)  CM WAS INFORMED BY CHARGE NURSE THAT THE PATIENT WILL NEED 02 AT NIGHT ONLY. CM REQUESTED COPY OF OVERNIGHT STUDY FROM RESPIRATORY. CM PLACED REFERRAL IN CAREPORT AND SENT TO [facility]. CM GAVE COPY OF REPORT TO DANIELLE, AND LET HER KNOW THAT THE BASIC INFO WAS IN CAREPORT. DANIELLE ASKED THAT HOSPITALIST TEAM INCLUDE QUALIFYING DIAGNOSIS IN D/C SUMMARY AND IN 02 ORDER. CM SENT SECURE CHAT TO HOSPITALIST TEAM AND WAS TOLD THAT THE PATIENT NOW QUALIFIES FOR CONTINUOUS 02 AND A NURSE WILL DOCUMENT THE RESTING ROOM SAT. CM AND DANIELLE DISCUSSED CHARGE AND WHAT CM WILL NEED INSTEAD. CM ASKED THAT HOSPITALIST TEAM PROVIDE CM WITH  QUALIFYING DIAGNOSIS SO CM CAN PEND 02 ORDER.   DISCHARGE SUMMARY IS NOW IN EPIC, CM OBTAINED DIAGNOSIS AND PENDED THE 02 ORDER AND SENT THE D/C SUMMARY TO [facility] AND NOTIFIED DANIELLE THAT ALL THAT REMAINS IS NOW SIGNED 02 ORDER. CM UPDATED PATIENT'S NURSE AND SPOKE WITH THE PATIENT AND HER HUSBAND. PATIENT STATES NO OTHER BARRIERS BESIDES 02. PATIENT STATES THAT She DRIVES AND IS INDEPENDENT AT HOME.     The patient will continue to be evaluated for developing discharge needs.     Case Manager: Luiz Ochoa, SOCIAL WORKER  Phone: [phone]

## 2023-03-13 NOTE — Discharge Summary (Signed)
[place] MEDICINE [facility]     DISCHARGE SUMMARY      PATIENT NAME:  Yesenia Walker   MRN:  [id number]  DOB:  08/09/1961    INPATIENT ADMISSION DATE: 03/11/2023   DATE OF DISCHARGE:  03/13/23     ATTENDING PHYSICIAN: Tommye Standard, DO     PRIMARY CARE PHYSICIAN: Mariah Milling, DO     HOSPITAL COURSE:    Patient is a 62 year old female with a history of depression, hypertension, migraines, mitral valve prolapse who presented with nausea, vomiting, shaking episode/rigors/chills following a dental extraction 2 days prior.  Patient has stopped her antibiotics secondary to nausea.  Patient reports that she has had episodes of diffuse tremors on and off for 1 year that seem to generally occur in the setting of acute illness.   On admission patient had mild hypoxemia as well as tachycardia.  CTA of the chest was negative for PE or other abnormal parenchymal findings with the exception of bibasilar atelectasis.  She was started on empiric ceftriaxone.  Patient was noted to have episodes of desaturation to a low 80s at night, and occasional desaturation to the high 80s during the day that was not exertional.  Patient had resolution of her GI symptoms with supportive care, she had significant improvement in the tremor but it was still present intermittently on her bilateral lower extremities while at rest. Echo showed no significant abnormalities.    Etiology for patient's new hypoxemia was unclear, possibly related to atelectasis, aspiration pneumonitis in the setting nausea and vomiting, possibly OSA, some concern for underlying neuromuscular condition given tremor.  Patient to follow up with pulmonology as well as Neurology for pulmonary function testing and further evaluation.  She was discharged with 1 L continuous oxygen.    Problem List:  Active Hospital Problems   (*Primary Problem)    Diagnosis    *Gastroenteritis    Tremor    Acute respiratory failure (CMS HCC)    Vomiting, unspecified vomiting type,  unspecified whether nausea present         PHYSICAL EXAM   DAY OF DISCHARGE:    BP 125/70   Pulse 90   Temp 36.6 C (97.8 F)   Resp 18   Ht 1.6 m (5\' 3" )   Wt 90.9 kg (200 lb 4.8 oz)   SpO2 90%   BMI 35.48 kg/m       General:  Patient is resting in bed, no acute distress, alert and oriented x3   Eyes:  PERRL, no scleral icterus   HENT:  Normocephalic, atraumatic, oral mucosa is moist and pink, no nasal discharge   Heart:  RRR, S1 and S2 auscultated, no murmurs appreciated   Lungs:  Unlabored respirations.  Lungs are clear to auscultation bilaterally, no wheezes, no rales  Abdomen:  Soft, active bowel sounds, non-tender to palpation, non-distended  Extremities:  Pulses equal in all extremities bilaterally.  Capillary refill less than 3 seconds.  No edema in lower extremities bilaterally   Skin:  Warm and dry.  Not diaphoretic  Neuro:  A&O x 3.  No focal deficits.  Speech intact.  Intermittent tremor of bilateral feet which improved with intention, no other tremor noted.  No clonus or myoclonus.  Psych:  Cooperative, not agitated    LABS:  CBC with Diff (Last 48 Hours):    Recent Results in last 48 hours     03/12/23  0935 03/13/23  0314   WBC 6.1 5.6   HGB  13.9 12.9   HCT 39.9 37.1   MCV 94.3 94.0   PLTCNT 217 207   PMNS 72  --    LYMPHOCYTES 22  --    MONOCYTES 5  --    EOSINOPHIL 1  --    BASOPHILS 1  0.00  --           Last BMP  (Last result in the past 48 hours)        Na   K   Cl   CO2   BUN   Cr   Calcium   Glucose   Glucose-Fasting        03/13/23 0314 142   3.9   109   29   19   0.62   8.9   97               Last Hepatic Panel  (Last result in the past 48 hours)        Albumin   Total PTN   Total Bili   Direct Bili   Ast/SGOT   Alt/SGPT   Alk Phos        03/12/23 0935 4.0   6.4   0.4     29   41   49                BMP (Last 48 Hours):    Recent Results in last 48 hours     03/12/23  0935 03/13/23  0314   SODIUM 143 142   POTASSIUM 4.0 3.9   CHLORIDE 110* 109*   CO2 28 29   BUN 16 19   CREATININE  0.76 0.62   CALCIUM 9.0 8.9   GLUCOSENF 137* 97          IMAGING:       DISCHARGE MEDICATIONS:     Current Discharge Medication List        CONTINUE these medications - NO CHANGES were made during your visit.        Details   Ajovy Autoinjector 225 mg/1.5 mL Auto-Injector  Generic drug: fremanezumab-vfrm   225 mg, Subcutaneous, EVERY 30 DAYS  Refills: 0     estradioL 0.01 % (0.1 mg/gram) Cream  Commonly known as: ESTRACE   1 g, Vaginal, EVERY MO AND TH  Refills: 0     famotidine 40 mg Tablet  Commonly known as: PEPCID   40 mg, Oral, 2 TIMES DAILY  Refills: 0     losartan 50 mg Tablet  Commonly known as: COZAAR   50 mg, Oral, DAILY  Refills: 0     metoprolol succinate 25 mg Tablet Sustained Release 24 hr  Commonly known as: TOPROL-XL   25 mg, Oral, DAILY  Refills: 0     pantoprazole 40 mg Tablet, Delayed Release (E.C.)  Commonly known as: PROTONIX   40 mg, Oral, DAILY  Refills: 0     sucralfate 1 gram Tablet  Commonly known as: CARAFATE   1 g, Oral, 2 TIMES DAILY BEFORE MEALS  Refills: 0     Ubrelvy 50 mg Tablet  Generic drug: ubrogepant   Oral  Refills: 0     venlafaxine 75 mg Capsule, Sust. Release 24 hr  Commonly known as: EFFEXOR XR   75 mg, Oral, DAILY  Refills: 0     VESIcare 10 mg Tablet  Generic drug: solifenacin   10 mg, Oral, DAILY  Refills: 0  DISCHARGE DISPOSITION:  DC home    DISCHARGE INSTRUCTIONS:       No discharge procedures on file.             Copies sent to Care Team         Relationship Specialty Notifications Start End    Mariah Milling, DO PCP - General FAMILY MEDICINE All results, Admissions 04/01/22     Phone: [phone] Fax: [phone]         [address] [place] [address]            >30 minutes total were spent coordinating discharge day today      Tommye Standard, DO  [facility] MEDICINE HOSPITALIST

## 2023-03-13 NOTE — Discharge Instructions (Addendum)
Follow up with pulmonology for lung testing as well as with Neurology regarding your tremor.

## 2023-03-13 NOTE — PT Evaluation (Signed)
[facility] Medicine [facility]  [address]  [place], [address]  [phone]  (Fax) [phone]  Rehabilitation Services  Physical Therapy Inpatient Initial Evaluation    Patient Name: Yesenia Walker  Date of Birth: May 05, 1961  Height: Height: 160 cm (5\' 3" )  Weight: Weight: 90.9 kg (200 lb 4.8 oz)  Room/Bed: 330/B  Payor: MEDICARE / Plan: MEDICARE PART A AND B / Product Type: Medicare /       PMH:  Past Medical History:   Diagnosis Date    Depression     Enterostenosis (CMS HCC)     HTN (hypertension)     Migraines     Mitral valve prolapse            Assessment:      Pt presenting with nausea and vomiting, has history of hypertension, mitral valve prolapse and depression. Prior to adm, pt was independent without use of AD, no history of falls. Currently concerned about her oxygen levels decreasing. Demonstrated independence throughout therapy eval with mobility, steady gait. No reports of SOB while ambulating. Oxygen WNL upon return to room, RN made aware pt is on room air. Has no acute PT needs at this time.    Discharge Needs:    Equipment Recommendation: none anticipated          Discharge Disposition: home    JUSTIFICATION OF DISCHARGE RECOMMENDATION   Based on current diagnosis, functional performance prior to admission, and current functional performance, this patient requires continued PT services in home         Plan:   To provide physical therapy services  for duration of evaluation only.    The risks/benefits of therapy have been discussed with the patient/caregiver and he/she is in agreement with the established plan of care.       Subjective & Objective     Past Medical History:   Diagnosis Date    Depression     Enterostenosis (CMS HCC)     HTN (hypertension)     Migraines     Mitral valve prolapse             Past Surgical History:   Procedure Laterality Date    BREAST MASS EXCISION      HX BREAST BIOPSY Right     NEGATIVE    HX TUBAL LIGATION      INCONTINENCE SURGERY       SHOULDER SURGERY             03/13/23 1053   Rehab Session   Document Type evaluation   PT Visit Date 03/13/23   Total PT Minutes: 11   Patient Effort excellent   Symptoms Noted During/After Treatment none   General Information   Patient Profile Reviewed yes   Medical Lines Telemetry   Respiratory Status nasal cannula   Existing Precautions/Restrictions full code   Mutuality/Individual Preferences   Anxieties, Fears or Concerns concerned about her oxygen levels   Living Environment   Lives With spouse   Living Arrangements house   Home Assessment: Stairs in Home   Home Accessibility stairs within home   Financial Concerns none   Transportation Available car;family or friend will provide   Living Environment Comment has stairs to get up to bedroom   Functional Level Prior   Ambulation 0 - independent   Transferring 0 - independent   Toileting 0 - independent   Bathing 0 - independent   Dressing 0 - independent   Eating  0 - independent   Communication 0 - understands/communicates without difficulty   Pre Treatment Status   Pre Treatment Patient Status Patient supine in bed;Nurse approved session   Support Present Pre Treatment  Family present   Communication Pre Treatment  Charge Nurse   Communication Pre Treatment Comment cleared for PT   Cognitive Assessment/Interventions   Behavior/Mood Observations behavior appropriate to situation, WNL/WFL;alert;cooperative   Orientation Status oriented x 4   Attention WNL/WFL   Follows Commands WNL   Vital Signs   Pre-Treatment Heart Rate (beats/min) 78   Post-treatment Heart Rate (beats/min) 80   Pre SpO2 (%) 95   O2 Delivery Pre Treatment supplemental O2   Post SpO2 (%) 94   O2 Delivery Post Treatment room air   Pain Assessment   Pretreatment Pain Rating 0/10 - no pain   Posttreatment Pain Rating 0/10 - no pain   RLE Assessment   RLE Assessment WFL- Within Functional Limits   LLE Assessment   LLE Assessment WFL- Within Functional Limits   Bed Mobility Assessment/Treatment    Bed Mobility, Assistive Device Head of Bed Elevated   Supine-Sit Independence independent   Transfer Assessment/Treatment   Sit-Stand Independence independent   Stand-Sit Independence independent   Sit-Stand-Sit, Assist Device None   Gait Assessment/Treatment   Total Distance Ambulated 180   Independence  independent   Assistive Device    (none)   Balance Skill Training   Sitting Balance: Static normal balance   Sitting, Dynamic (Balance) normal balance   Sit-to-Stand Balance normal balance   Standing Balance: Static normal balance   Standing Balance: Dynamic good balance   Post Treatment Status   Post Treatment Patient Status Patient sitting on edge of bed;Call light within reach   Support Present Post Treatment  Family present   Communication Post Treatement Nurse   Communication Post Treatment Comment Pt on room air   Physical Therapy Clinical Impression   Assessment Pt presenting with nausea and vomiting, has history of hypertension, mitral valve prolapse and depression. Prior to adm, pt was independent without use of AD, no history of falls. Currently concerned about her oxygen levels decreasing. Demonstrated independence throughout therapy eval with mobility, steady gait. No reports of SOB while ambulating. Oxygen WNL upon return to room, RN made aware pt is on room air. Has no acute PT needs at this time.   Predicted Duration of Therapy Intervention (days/wks) evaluation only   Anticipated Equipment Needs at Discharge (PT) none anticipated   Anticipated Discharge Disposition home   Evaluation Complexity Justification   Patient History: Co-morbidity/factors that impact Plan of Care 1-2 that impact Plan of Care   Examination Components Range of motion;Strength;Balance;Bed mobility;Transfers;Ambulation   Presentation Stable: Uncomplicated, straight-forward, problem focused   Clinical Decision Making Low complexity   Evaluation Complexity Low complexity   Psychosocial Support   Trust Relationship/Rapport care  explained;questions encouraged;questions answered;thoughts/feelings acknowledged             INTERVENTION MINUTES: EVALUATION 11 minutes    EVALUATION COMPLEXITY : CLINICAL DECISION MAKING OF LOW COMPLEXITY AS INDICATED BY PMH, PHYSICAL THERAPY ASSESSMENT OF MUSCULOSKELETAL AND NEUROLOGICAL SYSTEMS AND ACTIVITY LIMITATIONS. CLINICAL PRESENTATION IS STABLE AND UNCOMPLICATED    Therapist:     Mileydi Milsap June Nadie Fiumara, PT  03/13/2023, 15:39

## 2023-03-17 LAB — ADULT ROUTINE BLOOD CULTURE, SET OF 2 BOTTLES (BACTERIA AND YEAST): BLOOD CULTURE, ROUTINE: NO GROWTH

## 2023-03-29 ENCOUNTER — Encounter (INDEPENDENT_AMBULATORY_CARE_PROVIDER_SITE_OTHER): Payer: Self-pay | Admitting: PULMONARY DISEASE

## 2023-03-29 ENCOUNTER — Other Ambulatory Visit: Payer: Self-pay

## 2023-03-29 ENCOUNTER — Inpatient Hospital Stay (HOSPITAL_BASED_OUTPATIENT_CLINIC_OR_DEPARTMENT_OTHER)
Admission: RE | Admit: 2023-03-29 | Discharge: 2023-03-29 | Disposition: A | Discharge status: Home / Self Care | Payer: Medicare Other | Source: Ambulatory Visit | Attending: PULMONARY DISEASE | Admitting: PULMONARY DISEASE

## 2023-03-29 ENCOUNTER — Ambulatory Visit: Payer: Medicare Other | Attending: PULMONARY DISEASE | Admitting: PULMONARY DISEASE

## 2023-03-29 VITALS — BP 130/80 | HR 78 | Temp 98.7°F | Resp 18 | Ht 63.0 in | Wt 205.0 lb

## 2023-03-29 DIAGNOSIS — G4733 Obstructive sleep apnea (adult) (pediatric): Secondary | ICD-10-CM

## 2023-03-29 DIAGNOSIS — R0602 Shortness of breath: Secondary | ICD-10-CM

## 2023-03-29 DIAGNOSIS — J9811 Atelectasis: Secondary | ICD-10-CM

## 2023-03-29 DIAGNOSIS — R0902 Hypoxemia: Secondary | ICD-10-CM

## 2023-03-29 DIAGNOSIS — Z9981 Dependence on supplemental oxygen: Secondary | ICD-10-CM

## 2023-03-29 NOTE — H&P (Signed)
[facility] [place]  [address]  [place] [address]       New Patient    Patient Name: Yesenia Walker  Date: 03/29/2023  Department:  PULMON[facility] Yesenia Walker  MRN: [id number]  DOB: 12-28-1960  Primary Care Provider:  Mariah Milling, DO  Referring Provider:  Mariah Milling, DO      Chief Complaint:   Chief Complaint   Patient presents with    Hospital Follow Up     Nursing Notes:   Cecile Sheerer, [place]  03/29/23 1106  Signed  Is the patient currently on oxygen therapy? Yes  What Liter Flow is the patient on?  2L  What DME company does the patient utilize for oxygen therapy?  [employer]  What is the patient on? n/a  What DME company does the patient utilize for CPAP/BiPAP/Trilogy? N/A  Smoking History:    Social History     Tobacco Use   Smoking Status Never    Passive exposure: Never   Smokeless Tobacco Never      Have you received a flu shot? No  Have you received a pneumonia shot? No  Has the patient had any tests performed since the last visit? CT and Chest Xray/PFT  If so, where were the test(s) performed?  [facility] /P[facility]  Has the patient had any recent hospitalizations? Yes  Does the patient have any other associated symptoms or modifying factors? Shortness of breath and Coughing           History of Present Illness:   Yesenia Walker is a 62 y.o., White female presents today for hypoxemia.  She went to hospital due to uncontrollable shaking.   Workup was negative for patient overall but she was low on O2.   She was on 2l/min nc O2 since then, sats 82% without it in hospital.    She has no prior lung disease in past.   She has been exposed to cleaning supplies in past.   No smoking.   Her parents had no lung disease.  She admits to having had some shortness of breath.   She was in hospital at [facility].       Ct chest negative for PE but she did have bibasilar atelectasis.    Echo was normal with normal LV function.   There was  pulmonary function.     Medical History  Past Medical History:   Diagnosis Date    Acute respiratory failure with hypoxemia (CMS HCC)     Depression     Enterostenosis (CMS HCC)     Esophageal reflux     HTN (hypertension)     Hyperlipidemia     Migraines     Mitral valve prolapse     Murmur, cardiac     Shortness of breath          Past Surgical History  Past Surgical History:   Procedure Laterality Date    BREAST MASS EXCISION      HX BREAST BIOPSY Right     NEGATIVE    HX TUBAL LIGATION      INCONTINENCE SURGERY      SHOULDER SURGERY           Medication List  Current Outpatient Medications   Medication Sig    estradioL (ESTRACE) 0.01 % (0.1 mg/gram) Vaginal Cream Insert 1 g into the vagina Every MON and THURS    famotidine (PEPCID) 40 mg Oral Tablet Take  1 Tablet (40 mg total) by mouth Twice daily    fremanezumab-vfrm (AJOVY AUTOINJECTOR) 225 mg/1.5 mL Subcutaneous Auto-Injector Inject 1.5 mL (225 mg total) under the skin Every 30 days    losartan (COZAAR) 50 mg Oral Tablet Take 1 Tablet (50 mg total) by mouth Once a day    metoprolol succinate (TOPROL-XL) 25 mg Oral Tablet Sustained Release 24 hr Take 1 Tablet (25 mg total) by mouth Once a day    MYRBETRIQ 25 mg Oral Tablet Sustained Release 24 hr Take 1 Tablet (25 mg total) by mouth Once a day    pantoprazole (PROTONIX) 40 mg Oral Tablet, Delayed Release (E.C.) Take 1 Tablet (40 mg total) by mouth Once a day    sucralfate (CARAFATE) 1 gram Oral Tablet Take 1 Tablet (1 g total) by mouth Twice a day before meals    ubrogepant (UBRELVY) 50 mg Oral Tablet Take by mouth    venlafaxine (EFFEXOR XR) 75 mg Oral Capsule, Sust. Release 24 hr Take 1 Capsule (75 mg total) by mouth Once a day     Allergy List  Allergy History as of 03/29/23       TRAMADOL         Noted Status Severity Type Reaction    04/15/12 1032 Dewayne Hatch D 04/15/12 Active Low  Itching    04/15/12 1032 Dewayne Hatch D 04/15/12 Active                 TRIMETHOPRIM         Noted Status Severity  Type Reaction    03/29/23 1052 Copen, [place], [place] 03/05/12 Active Low  Diarrhea, Nausea/ Vomiting    Comments: Other Reaction(s): Not available, vomiting                   Family History   Family Medical History:       Problem Relation (Age of Onset)    Breast Cancer Maternal Aunt    No Known Problems Mother, Father, Sister, Brother, Maternal Grandmother, Maternal Grandfather, Paternal Grandmother, Paternal Grandfather, Daughter, Son, Maternal Uncle, Paternal Aunt, Paternal Education officer, community, Other            Social History  Social History     Socioeconomic History    Marital status: Married   Tobacco Use    Smoking status: Never     Passive exposure: Never    Smokeless tobacco: Never   Vaping Use    Vaping status: Never Used   Substance and Sexual Activity    Alcohol use: Never    Drug use: Never     Social Determinants of Health     Social Connections: Low Risk  (03/11/2023)    Social Connections     SDOH Social Isolation: 5 or more times a week        Review of system  General:  Denies fever, chills, night sweats, fatigue, loss of appetite.  Neurological:  Denies headache, snoring.   Gastrointestinal:  Denies reflux, heartburn, diarrhea.  Cardiovascular:  Denies chest pain, irregular heart beats.  Respiratory:  Denies  hemoptysis, anesthesia problems, wheezing, asbestos exposure.  Musculoskeletal:  Denies joint pain, restless legs.  Endocrine/Metabolic:  Denies weight gain, weight loss.  Immunologic:  Denies sinus allergy symptoms.  Mental Status/Psychiatric:  Denies anxiety, depression.  ENT:  Denies nasal congestion, hoarseness, postnasal drip.  Integumentary:  Denies rash, new skin lesions.    Vital Signs:  Vitals:    03/29/23 1101   BP: 130/80   Pulse:  78   Resp: 18   Temp: 37.1 C (98.7 F)   SpO2: 98%   Weight: 93 kg (205 lb)   Height: 1.6 m (5\' 3" )   BMI: 36.39         PHYSICAL EXAMINATION:   Constitutional:  Vital signs stable.  General appearance of the patient:  Alert, no acute distress.  Normal appearance, well  nourished.  Eyes: PERRLA and normal eye lids.  Conjunctiva normal.  Ears, Nose, Mouth, and Throat: External inspection of ears and nose with normal appearance.  Inspection of lips, teeth and gums with normal appearance and oral mucosa normal.  Neck: Supple with trachea midline, non tender, no nodules, no masses, gland position midline.  Respiratory:  Auscultation of lungs with normal breath sounds, no rales, no rhonchi, no wheezing.  Respiratory effort with no tractions, breathing regular and unlabored.  Cardiovascular:  Regular rhythm and regular rate.  No murmur, no peripheral edema.  Gastrointestinal: Abdomen non-tender, no masses, no hepatomegaly present.  Lymphatic:  No lymphadenopathy present, no supraclavicular nodes.  Musculoskeletal:  Normal gait and station, normal digits, no digital cyanosis or clubbing.  Mental Status/Psychiatric:  Alert, grossly oriented to person, place, and time.  Appropriate and normal mood.    Assessment    ICD-10-CM    1. Hypoxemia  R09.02 SIX MINUTE WALK TEST ( )      2. Atelectasis  J98.11 SIX MINUTE WALK TEST ( )      3. SOB (shortness of breath)  R06.02       4. OSA (obstructive sleep apnea)  G47.33           Plan    She has normal exam and PFT today  Workup c/w atelectasis causing hypoxemia in setting of medical illness.   Recommend sleep study for possible sleep apnea  Push nutrition and exercise and weight loss.  today DC O2 if negative.    Continue medications as prescribed/directed unless changed by provider.  Plan of care discussed with patient.    Return in about 28 weeks (around 10/11/2023).    The patient was given the opportunity to ask questions and those questions were answered to the patient's satisfaction. The patient was encouraged to call with any additional questions or concerns. Discussed with the patient effects and side effects of medications. Medication safety was discussed.  The patient was informed to contact the office within 7 business days if  a message/lab results/referral/imaging results have not been conveyed to the patient.    Electronically signed by Regenia Skeeter, MD  Pulmonary and Critical care    This note may have been partially generated using MModal Fluency Direct system, and there may be some incorrect words, spellings, and punctuation that were not noted in checking the note before saving.

## 2023-03-29 NOTE — Nursing Note (Signed)
Is the patient currently on oxygen therapy? Yes  What Liter Flow is the patient on?  2L  What DME company does the patient utilize for oxygen therapy?  [facility]  What is the patient on? n/a  What DME company does the patient utilize for CPAP/BiPAP/Trilogy? N/A  Smoking History:    Social History     Tobacco Use   Smoking Status Never    Passive exposure: Never   Smokeless Tobacco Never      Have you received a flu shot? No  Have you received a pneumonia shot? No  Has the patient had any tests performed since the last visit? CT and Chest Xray/PFT  If so, where were the test(s) performed?  [facility] /[facility]  Has the patient had any recent hospitalizations? Yes  Does the patient have any other associated symptoms or modifying factors? Shortness of breath and Coughing

## 2023-03-29 NOTE — Procedures (Signed)
[facility]  PULMONARY FUNCTION TESTING, [facility] [place]  [address]  [place] [address]  [phone]    SIX MINUTE WALK    PERFORMING DEPT  PULMONARY FUNCTION TESTING[facility]        6 MIN WALK TEST INFO       6 MIN WALK PRE TEST DATA  Pre Test Data (at rest)  Pulse Ox: 95 %  Heart Rate: 76 bpm    1 MIN DATA  1 Minute Data  Pulse Ox: 94 %  Heart Rate: 82 bpm    2 MIN DATA  2 Minute Data  Pulse Ox: 95 %  Heart Rate: 86 bpm    3 MIN DATA  3 Minute Data  Pulse Ox: 92 %  Heart Rate: 91 bpm    4 MIN DATA  4 Minute Data  Pulse Ox: 93 %  Heart Rate: 96 bpm    5 MIN DATA  5 Minute Data  Pulse Ox: 92 %  Heart Rate: 98 bpm    6 MIN DATA  6 Minute Data (immediately after exercise)  Pulse Ox: 92 %  Heart Rate: 99 bpm    5 MIN POST TEST  5 Minute Data (post exercise)  Pulse Ox: 91 %  Heart Rate: 97 bpm    SUMMARY OF TEST DATA  Summary of test data  Number of Laps: 15  Additional meters: 0  Total laps x 30 meters + additional meters=: 450    PREVIOUS INFORMATION 6 MIN WALK TEST       Shawnee Knapp, RTR      Above data personally reviewed  No hypoxemia was noted.  DC supplemental O2 at this time.    Regenia Skeeter, MD  03/29/2023 15:57

## 2023-04-02 DIAGNOSIS — R0902 Hypoxemia: Secondary | ICD-10-CM

## 2023-04-04 ENCOUNTER — Telehealth (INDEPENDENT_AMBULATORY_CARE_PROVIDER_SITE_OTHER): Payer: Self-pay | Admitting: PULMONARY DISEASE

## 2023-04-04 DIAGNOSIS — R0602 Shortness of breath: Secondary | ICD-10-CM

## 2023-04-04 NOTE — Telephone Encounter (Signed)
The patient called in about getting an order for her O2 to be discontinued. Since her walk test was negative for desat. Okay per dr Mellody Dance to d/c O2 sending order to [facility].

## 2023-05-04 ENCOUNTER — Other Ambulatory Visit (HOSPITAL_COMMUNITY): Payer: Self-pay | Admitting: Family Medicine

## 2023-05-04 DIAGNOSIS — R0600 Dyspnea, unspecified: Secondary | ICD-10-CM

## 2023-05-04 DIAGNOSIS — R0902 Hypoxemia: Secondary | ICD-10-CM

## 2023-05-09 ENCOUNTER — Other Ambulatory Visit: Payer: Self-pay

## 2023-05-09 ENCOUNTER — Ambulatory Visit
Admission: RE | Admit: 2023-05-09 | Discharge: 2023-05-09 | Disposition: A | Discharge status: Still In Hospital | Payer: Medicare Other | Source: Ambulatory Visit | Attending: Family Medicine | Admitting: Family Medicine

## 2023-05-09 DIAGNOSIS — R0902 Hypoxemia: Secondary | ICD-10-CM

## 2023-05-09 DIAGNOSIS — R0600 Dyspnea, unspecified: Secondary | ICD-10-CM

## 2023-06-21 ENCOUNTER — Telehealth (INDEPENDENT_AMBULATORY_CARE_PROVIDER_SITE_OTHER): Payer: Self-pay | Admitting: PULMONARY DISEASE

## 2023-06-21 IMAGING — MR MRI BRAIN WITHOUT AND WITH CONTRAST
8 of 11 series · 33 of 48 positions shown · IV contrast (gadavist)
Comparison: None available.

﻿EXAM:  59006   MRI BRAIN WITHOUT AND WITH CONTRAST
INDICATION: Headaches, tremors.
TECHNIQUE: Multiplanar, multisequence MRI was performed both before and after the intravenous administration of 5 mL Gadavist.

[Series 5: DWI · axial · 5.0mm · 1.35mm/px · z∈[-67,+59]mm · 9 of 88 slices shown (1 of 3)]
[im 1/88]
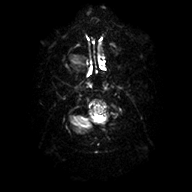
[im 16/88]
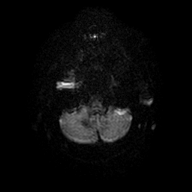
[im 24/88]
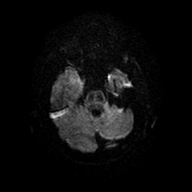
[im 40/88]
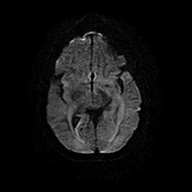
[im 48/88]
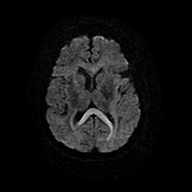
[im 64/88]
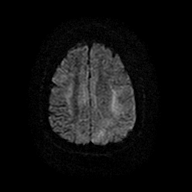
[im 72/88]
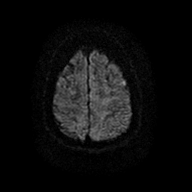
[im 80/88]
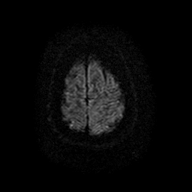
[im 88/88]
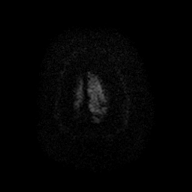

[Series 6: DWI · axial · 5.0mm · 1.35mm/px · z∈[-67,+59]mm · 2 of 22 slices shown (2 of 3)]
[im 1/22]
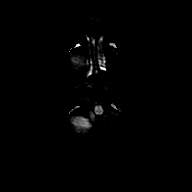
[im 22/22]
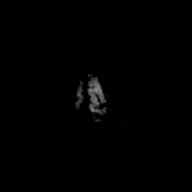

[Series 7: DWI · axial · 5.0mm · 1.35mm/px · z∈[-67,+59]mm · 2 of 20 slices shown (3 of 3)]
[im 1/20]
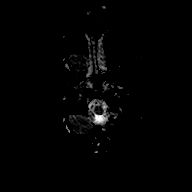
[im 20/20]
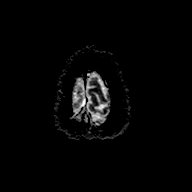

[Series 8: FLAIR · sagittal · 4.0mm · 0.75mm/px · 4 of 26 slices shown (1 of 2)]
[im 1/26]
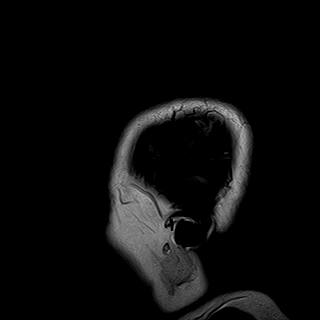
[im 9/26]
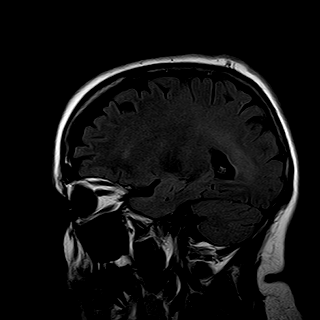
[im 17/26]
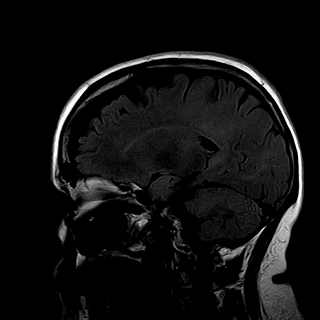
[im 26/26]
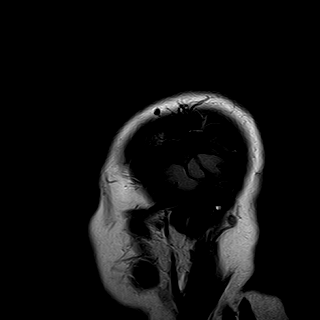

[Series 9: T2 · axial · 5.0mm · 0.43mm/px · z∈[-74,+64]mm · 4 of 24 slices shown (1 of 2)]
[im 1/24]
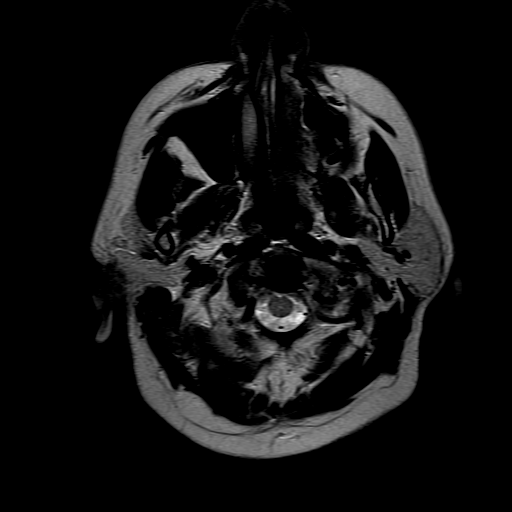
[im 8/24]
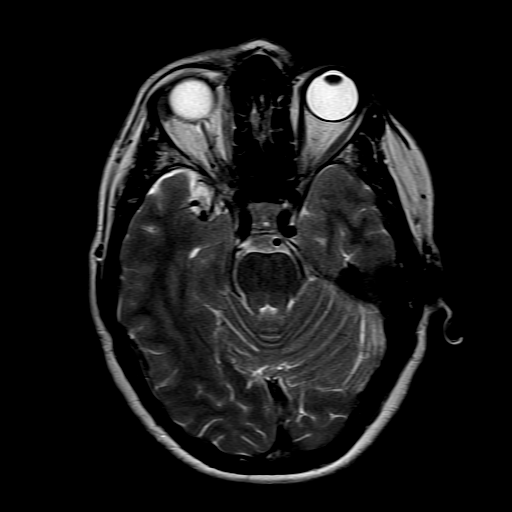
[im 16/24]
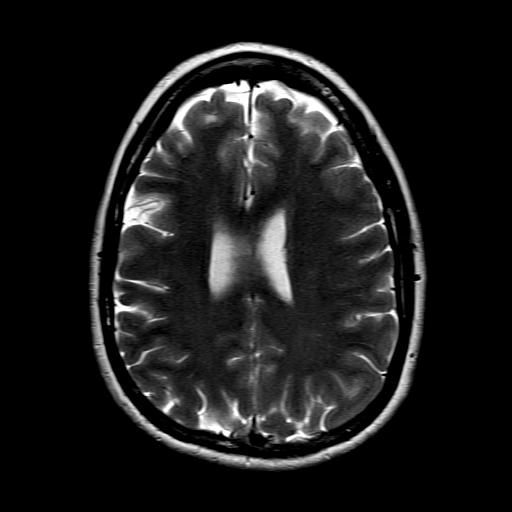
[im 24/24]
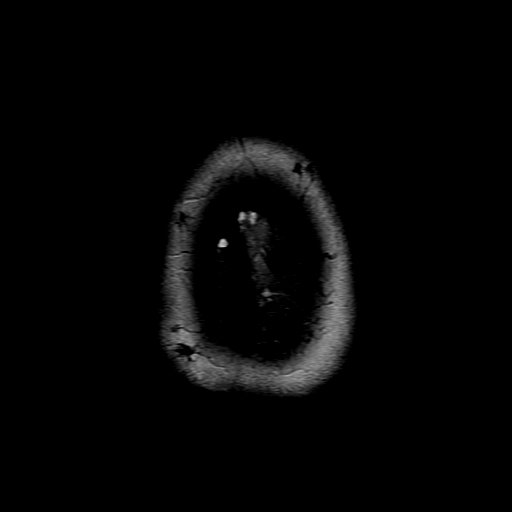

[Series 10: FLAIR · axial · 5.0mm · 0.43mm/px · z∈[-77,+67]mm · 4 of 25 slices shown (2 of 2)]
[im 1/25]
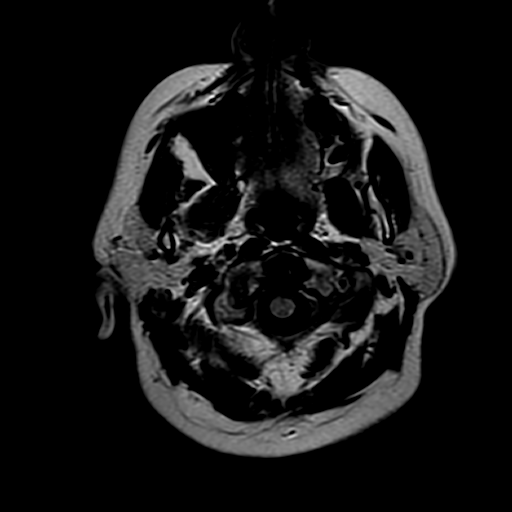
[im 9/25]
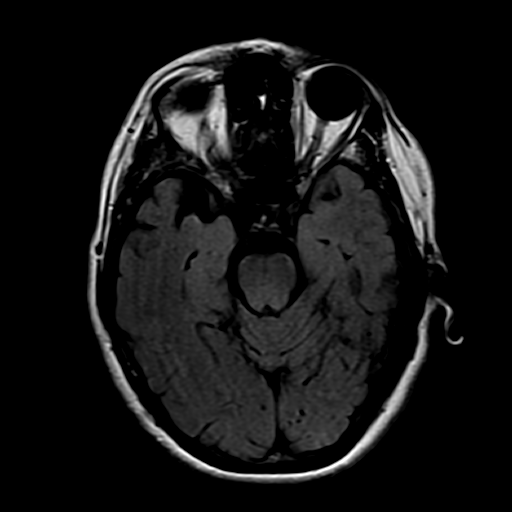
[im 17/25]
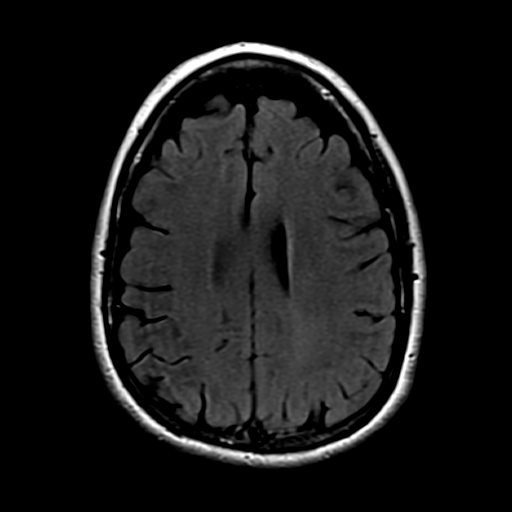
[im 25/25]
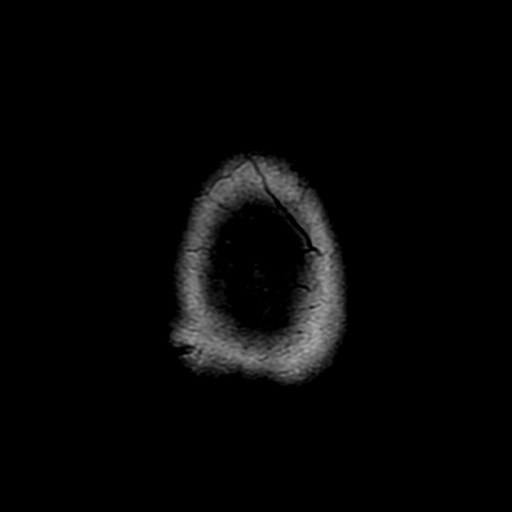

[Series 12: T2 · coronal · 6.0mm · 0.43mm/px · 4 of 24 slices shown (2 of 2)]
[im 1/24]
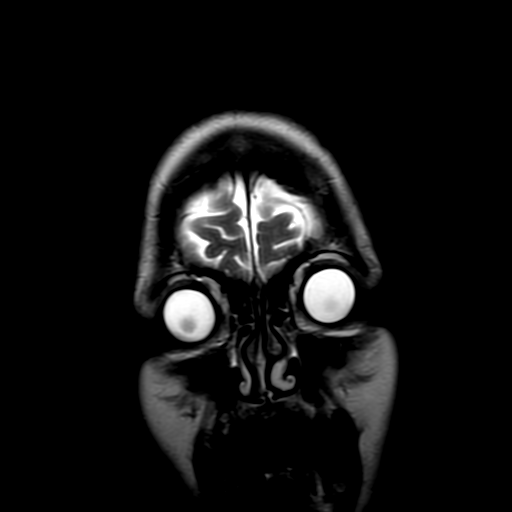
[im 8/24]
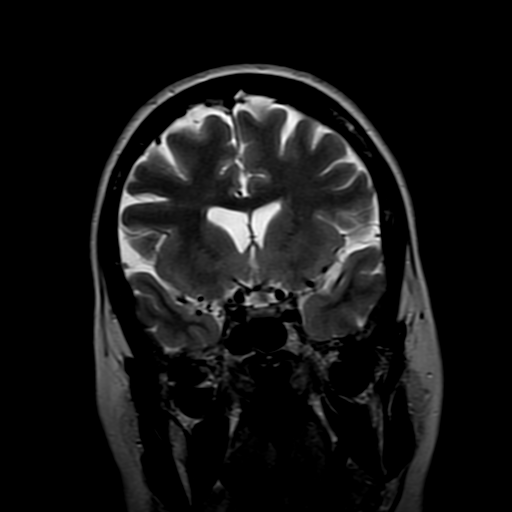
[im 16/24]
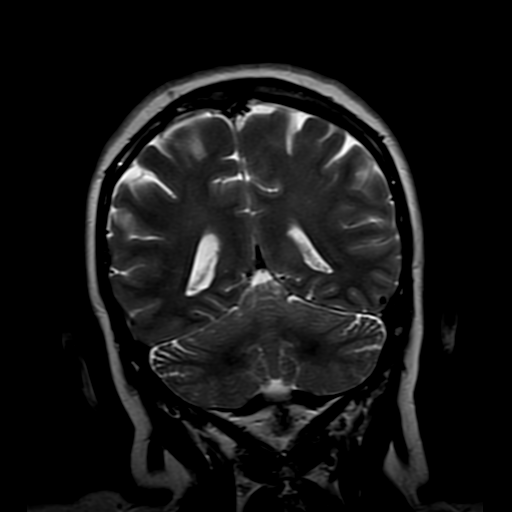
[im 24/24]
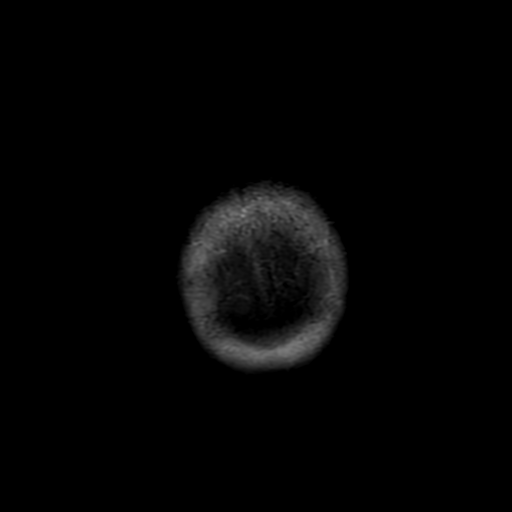

[Series 14: T1 post-contrast · axial · 5.0mm · 0.43mm/px · z∈[-77,+67]mm · 4 of 25 slices shown]
[im 1/25]
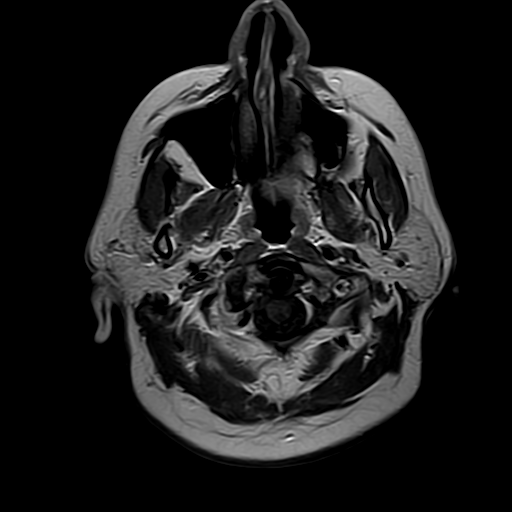
[im 9/25]
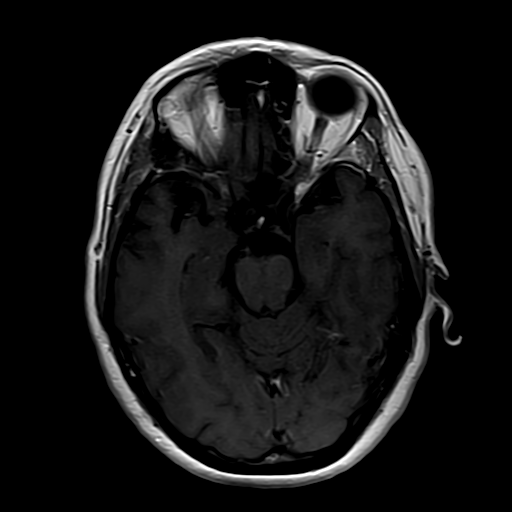
[im 17/25]
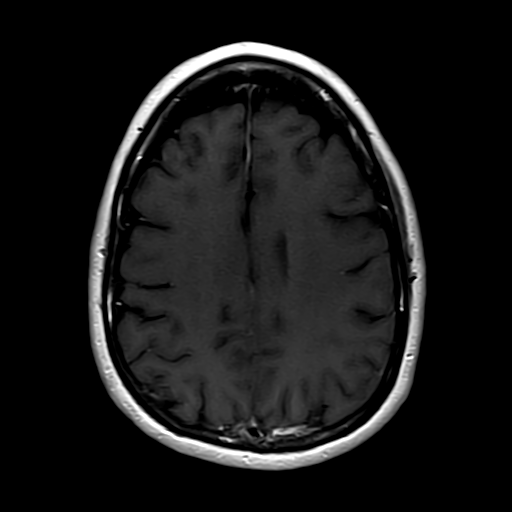
[im 25/25]
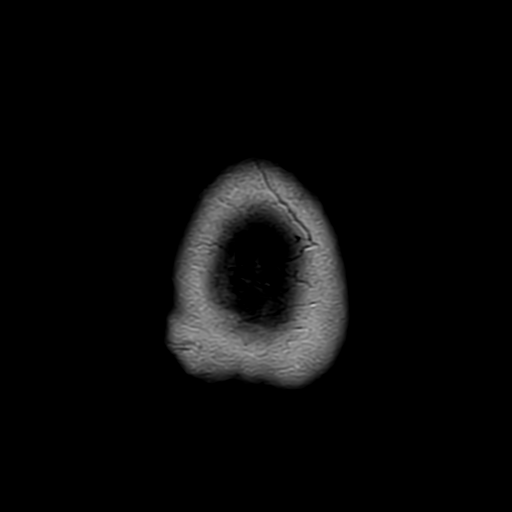

[33 of 48 positions shown; findings below may reference images not displayed]

FINDINGS: There is minimal chronic small vessel ischemic change.  The ventricles and CSF spaces appear within normal limits for the patient’s age.  

There is no mass, hemorrhage, shift of the midline structures, or extra-axial collection.  No abnormal enhancement is seen.  

The paranasal sinuses and mastoid air cells are essentially clear.
IMPRESSION: 1. Minimal chronic small vessel ischemic change.  

2. Otherwise, normal examination.

## 2023-06-21 NOTE — Telephone Encounter (Signed)
Received referral from Mariah Milling DO dx dyspnea - estab Mellody Dance pt last seen 4.25.24 and follow up scheduled 11.7.24 - do we need to bring pt in sooner or ok to keep appts as is? Thanks

## 2023-06-22 NOTE — Telephone Encounter (Signed)
Noted referral and will attempt to schedule from referral.

## 2023-06-24 ENCOUNTER — Inpatient Hospital Stay (HOSPITAL_COMMUNITY): Payer: Medicare Other

## 2023-06-24 ENCOUNTER — Other Ambulatory Visit: Payer: Self-pay

## 2023-06-24 ENCOUNTER — Encounter (HOSPITAL_COMMUNITY): Payer: Self-pay

## 2023-06-24 ENCOUNTER — Emergency Department (HOSPITAL_COMMUNITY): Payer: Medicare Other

## 2023-06-24 ENCOUNTER — Inpatient Hospital Stay
Admission: EM | Admit: 2023-06-24 | Discharge: 2023-06-26 | DRG: 091 | Disposition: A | Discharge status: Home / Self Care | Payer: Medicare Other | Attending: HOSPITALIST | Admitting: HOSPITALIST

## 2023-06-24 DIAGNOSIS — R232 Flushing: Secondary | ICD-10-CM

## 2023-06-24 DIAGNOSIS — F32A Depression, unspecified: Secondary | ICD-10-CM | POA: Diagnosis present

## 2023-06-24 DIAGNOSIS — R011 Cardiac murmur, unspecified: Secondary | ICD-10-CM | POA: Diagnosis present

## 2023-06-24 DIAGNOSIS — G43909 Migraine, unspecified, not intractable, without status migrainosus: Secondary | ICD-10-CM | POA: Diagnosis present

## 2023-06-24 DIAGNOSIS — I341 Nonrheumatic mitral (valve) prolapse: Secondary | ICD-10-CM | POA: Diagnosis present

## 2023-06-24 DIAGNOSIS — I10 Essential (primary) hypertension: Secondary | ICD-10-CM | POA: Diagnosis present

## 2023-06-24 DIAGNOSIS — R110 Nausea: Secondary | ICD-10-CM

## 2023-06-24 DIAGNOSIS — R000 Tachycardia, unspecified: Secondary | ICD-10-CM

## 2023-06-24 DIAGNOSIS — G4733 Obstructive sleep apnea (adult) (pediatric): Secondary | ICD-10-CM | POA: Diagnosis present

## 2023-06-24 DIAGNOSIS — K219 Gastro-esophageal reflux disease without esophagitis: Secondary | ICD-10-CM | POA: Diagnosis present

## 2023-06-24 DIAGNOSIS — Z79899 Other long term (current) drug therapy: Secondary | ICD-10-CM

## 2023-06-24 DIAGNOSIS — R251 Tremor, unspecified: Principal | ICD-10-CM | POA: Diagnosis present

## 2023-06-24 DIAGNOSIS — J9601 Acute respiratory failure with hypoxia: Secondary | ICD-10-CM | POA: Diagnosis present

## 2023-06-24 DIAGNOSIS — F419 Anxiety disorder, unspecified: Secondary | ICD-10-CM | POA: Diagnosis present

## 2023-06-24 DIAGNOSIS — Z885 Allergy status to narcotic agent status: Secondary | ICD-10-CM

## 2023-06-24 DIAGNOSIS — N390 Urinary tract infection, site not specified: Secondary | ICD-10-CM | POA: Diagnosis present

## 2023-06-24 DIAGNOSIS — J9811 Atelectasis: Secondary | ICD-10-CM | POA: Diagnosis present

## 2023-06-24 DIAGNOSIS — E785 Hyperlipidemia, unspecified: Secondary | ICD-10-CM | POA: Diagnosis present

## 2023-06-24 DIAGNOSIS — Z881 Allergy status to other antibiotic agents status: Secondary | ICD-10-CM

## 2023-06-24 DIAGNOSIS — R112 Nausea with vomiting, unspecified: Secondary | ICD-10-CM | POA: Diagnosis present

## 2023-06-24 LAB — URINALYSIS, MACROSCOPIC
BILIRUBIN: NEGATIVE mg/dL
BLOOD: NEGATIVE mg/dL
GLUCOSE: NEGATIVE mg/dL
KETONES: 20 mg/dL — AB
LEUKOCYTES: 25 WBCs/uL — AB
NITRITE: NEGATIVE
PH: 6.5 (ref 5.0–9.0)
PROTEIN: 50 mg/dL — AB
SPECIFIC GRAVITY: 1.027 (ref 1.002–1.030)
UROBILINOGEN: NORMAL mg/dL

## 2023-06-24 LAB — ARTERIAL BLOOD GAS/LACTATE
%FIO2 (ARTERIAL): 21 %
BASE EXCESS (ARTERIAL): 1.8 mmol/L (ref 0.0–2.0)
BICARBONATE (ARTERIAL): 25.7 mmol/L (ref 20.0–26.0)
CARBOXYHEMOGLOBIN: 1.4 % (ref <=1.5)
MET-HEMOGLOBIN: 0.3 % (ref <=2.0)
O2CT: 20.5 %
OXYHEMOGLOBIN: 91.3 % (ref 88.0–100.0)
PCO2 (ARTERIAL): 41 mm/Hg (ref 35–45)
PH (ARTERIAL): 7.42 (ref 7.35–7.45)
PO2 (ARTERIAL): 62 mm/Hg — ABNORMAL LOW (ref 80–100)

## 2023-06-24 LAB — CBC WITH DIFF
BASOPHIL #: 0 10*3/uL (ref 0.00–0.10)
BASOPHIL %: 0 % (ref 0–1)
EOSINOPHIL #: 0 10*3/uL (ref 0.00–0.50)
EOSINOPHIL %: 1 %
HCT: 45.9 % — ABNORMAL HIGH (ref 31.2–41.9)
HGB: 15.7 g/dL — ABNORMAL HIGH (ref 10.9–14.3)
LYMPHOCYTE #: 1.7 10*3/uL (ref 1.00–3.00)
LYMPHOCYTE %: 20 % (ref 16–44)
MCH: 32.5 pg (ref 24.7–32.8)
MCHC: 34.2 g/dL (ref 32.3–35.6)
MCV: 94.9 fL (ref 75.5–95.3)
MONOCYTE #: 0.4 10*3/uL (ref 0.30–1.00)
MONOCYTE %: 5 % (ref 5–13)
MPV: 9.3 fL (ref 7.9–10.8)
NEUTROPHIL #: 6 10*3/uL (ref 1.85–7.80)
NEUTROPHIL %: 74 % (ref 43–77)
PLATELETS: 274 10*3/uL (ref 140–440)
RBC: 4.83 10*6/uL (ref 3.63–4.92)
RDW: 12.6 % (ref 12.3–17.7)
WBC: 8.1 10*3/uL (ref 3.8–11.8)

## 2023-06-24 LAB — HEPATIC FUNCTION PANEL
ALBUMIN/GLOBULIN RATIO: 1.7 — ABNORMAL HIGH (ref 0.8–1.4)
ALBUMIN: 4.7 g/dL (ref 3.5–5.7)
ALKALINE PHOSPHATASE: 66 U/L (ref 34–104)
ALT (SGPT): 39 U/L (ref 7–52)
AST (SGOT): 28 U/L (ref 13–39)
BILIRUBIN DIRECT: 0.12 md/dL (ref <=0.20)
BILIRUBIN TOTAL: 0.6 mg/dL (ref 0.3–1.2)
BILIRUBIN, INDIRECT: 0.48 mg/dL (ref <=1)
GLOBULIN: 2.8 — ABNORMAL LOW (ref 2.9–5.4)
PROTEIN TOTAL: 7.5 g/dL (ref 6.4–8.9)

## 2023-06-24 LAB — URINALYSIS, MICROSCOPIC
RBCS: <1 /hpf (ref <4)
SQUAMOUS EPITHELIAL: 7 /hpf (ref <28)
WBCS: 6 /hpf — ABNORMAL HIGH (ref <6)

## 2023-06-24 LAB — BASIC METABOLIC PANEL
ANION GAP: 9 mmol/L (ref 4–13)
BUN/CREA RATIO: 15 (ref 6–22)
BUN: 13 mg/dL (ref 7–25)
CALCIUM: 9.6 mg/dL (ref 8.6–10.3)
CHLORIDE: 107 mmol/L (ref 98–107)
CO2 TOTAL: 27 mmol/L (ref 21–31)
CREATININE: 0.85 mg/dL (ref 0.60–1.30)
ESTIMATED GFR: 78 mL/min/{1.73_m2} (ref >59)
GLUCOSE: 123 mg/dL — ABNORMAL HIGH (ref 74–109)
OSMOLALITY, CALCULATED: 287 mOsm/kg (ref 270–290)
POTASSIUM: 3.9 mmol/L (ref 3.5–5.1)
SODIUM: 143 mmol/L (ref 136–145)

## 2023-06-24 LAB — LACTIC ACID LEVEL W/ REFLEX FOR LEVEL >2.0: LACTIC ACID: 1.6 mmol/L (ref 0.5–2.2)

## 2023-06-24 LAB — LIPASE: LIPASE: 17 U/L (ref 11–82)

## 2023-06-24 LAB — GOLD TOP TUBE

## 2023-06-24 LAB — BLUE TOP TUBE

## 2023-06-24 LAB — MAGNESIUM: MAGNESIUM: 2 mg/dL (ref 1.9–2.7)

## 2023-06-24 LAB — THYROID STIMULATING HORMONE (SENSITIVE TSH): TSH: 1.046 u[IU]/mL (ref 0.450–5.330)

## 2023-06-24 LAB — TROPONIN-I: TROPONIN I: 8 ng/L (ref <15)

## 2023-06-24 MED ORDER — DIPHENHYDRAMINE 50 MG/ML INJECTION SOLUTION
INTRAMUSCULAR | Status: AC
Start: 2023-06-24 — End: 2023-06-24
  Filled 2023-06-24: qty 1

## 2023-06-24 MED ORDER — PANTOPRAZOLE 40 MG TABLET,DELAYED RELEASE
40.0000 mg | DELAYED_RELEASE_TABLET | Freq: Every day | ORAL | Status: DC
Start: 2023-06-24 — End: 2023-06-26
  Administered 2023-06-24 – 2023-06-26 (×3): 40 mg via ORAL
  Filled 2023-06-24 (×2): qty 1

## 2023-06-24 MED ORDER — SODIUM CHLORIDE 0.9 % (FLUSH) INJECTION SYRINGE
3.0000 mL | INJECTION | INTRAMUSCULAR | Status: DC | PRN
Start: 2023-06-24 — End: 2023-06-26

## 2023-06-24 MED ORDER — METOPROLOL SUCCINATE ER 25 MG TABLET,EXTENDED RELEASE 24 HR
25.0000 mg | ORAL_TABLET | Freq: Every day | ORAL | Status: DC
Start: 2023-06-24 — End: 2023-06-24

## 2023-06-24 MED ORDER — SUCRALFATE 1 GRAM TABLET
1.0000 g | ORAL_TABLET | Freq: Two times a day (BID) | ORAL | Status: DC
Start: 2023-06-25 — End: 2023-06-26
  Administered 2023-06-25 – 2023-06-26 (×3): 1 g via ORAL
  Filled 2023-06-24 (×3): qty 1

## 2023-06-24 MED ORDER — ENOXAPARIN 40 MG/0.4 ML SUBCUTANEOUS SYRINGE
INJECTION | SUBCUTANEOUS | Status: AC
Start: 2023-06-24 — End: 2023-06-24
  Filled 2023-06-24: qty 0.4

## 2023-06-24 MED ORDER — PROCHLORPERAZINE EDISYLATE 10 MG/2 ML (5 MG/ML) INJECTION SOLUTION
INTRAMUSCULAR | Status: AC
Start: 2023-06-24 — End: 2023-06-24
  Filled 2023-06-24: qty 2

## 2023-06-24 MED ORDER — METOPROLOL SUCCINATE ER 25 MG TABLET,EXTENDED RELEASE 24 HR
ORAL_TABLET | ORAL | Status: AC
Start: 2023-06-24 — End: 2023-06-24
  Filled 2023-06-24: qty 1

## 2023-06-24 MED ORDER — SODIUM CHLORIDE 0.9 % INTRAVENOUS SOLUTION
INTRAVENOUS | Status: AC
Start: 2023-06-24 — End: ?
  Administered 2023-06-25: 0 mL via INTRAVENOUS

## 2023-06-24 MED ORDER — LORAZEPAM 1 MG TABLET
1.0000 mg | ORAL_TABLET | ORAL | Status: DC | PRN
Start: 2023-06-24 — End: 2023-06-24

## 2023-06-24 MED ORDER — LOSARTAN 50 MG TABLET
50.0000 mg | ORAL_TABLET | Freq: Every day | ORAL | Status: DC
Start: 2023-06-24 — End: 2023-06-26
  Administered 2023-06-24 – 2023-06-25 (×2): 50 mg via ORAL
  Administered 2023-06-26: 0 mg via ORAL
  Filled 2023-06-24 (×2): qty 1

## 2023-06-24 MED ORDER — ONDANSETRON HCL (PF) 4 MG/2 ML INJECTION SOLUTION
4.0000 mg | Freq: Four times a day (QID) | INTRAMUSCULAR | Status: DC | PRN
Start: 2023-06-24 — End: 2023-06-26
  Administered 2023-06-25: 4 mg via INTRAVENOUS

## 2023-06-24 MED ORDER — IOHEXOL 350 MG IODINE/ML INTRAVENOUS SOLUTION
50.0000 mL | INTRAVENOUS | Status: AC
Start: 2023-06-24 — End: 2023-06-24
  Administered 2023-06-24: 100 mL via INTRAVENOUS

## 2023-06-24 MED ORDER — SODIUM CHLORIDE 0.9 % (FLUSH) INJECTION SYRINGE
3.0000 mL | INJECTION | Freq: Three times a day (TID) | INTRAMUSCULAR | Status: DC
Start: 2023-06-24 — End: 2023-06-26
  Administered 2023-06-24: 3 mL
  Administered 2023-06-24: 0 mL
  Administered 2023-06-25: 3 mL
  Administered 2023-06-25 – 2023-06-26 (×3): 0 mL

## 2023-06-24 MED ORDER — SODIUM CHLORIDE 0.9 % IV BOLUS
30.0000 mL/kg | INJECTION | Status: AC
Start: 2023-06-24 — End: 2023-06-24
  Administered 2023-06-24: 1572 mL via INTRAVENOUS
  Administered 2023-06-24: 0 mL via INTRAVENOUS

## 2023-06-24 MED ORDER — DIPHENHYDRAMINE 50 MG/ML INJECTION SOLUTION
12.5000 mg | INTRAMUSCULAR | Status: AC
Start: 2023-06-24 — End: 2023-06-24
  Administered 2023-06-24: 12.5 mg via INTRAVENOUS

## 2023-06-24 MED ORDER — VENLAFAXINE ER 75 MG CAPSULE,EXTENDED RELEASE 24 HR
75.0000 mg | ORAL_CAPSULE | Freq: Every day | ORAL | Status: DC
Start: 2023-06-24 — End: 2023-06-26
  Administered 2023-06-24 – 2023-06-26 (×3): 75 mg via ORAL
  Filled 2023-06-24 (×5): qty 1

## 2023-06-24 MED ORDER — METOPROLOL SUCCINATE ER 25 MG TABLET,EXTENDED RELEASE 24 HR
25.0000 mg | ORAL_TABLET | Freq: Every day | ORAL | Status: DC
Start: 2023-06-24 — End: 2023-06-26
  Administered 2023-06-24 – 2023-06-25 (×2): 25 mg via ORAL
  Administered 2023-06-26: 0 mg via ORAL
  Filled 2023-06-24 (×2): qty 1

## 2023-06-24 MED ORDER — SODIUM CHLORIDE 0.9 % (FLUSH) INJECTION SYRINGE
3.0000 mL | INJECTION | Freq: Three times a day (TID) | INTRAMUSCULAR | Status: DC
Start: 2023-06-24 — End: 2023-06-26
  Administered 2023-06-24: 0 mL
  Administered 2023-06-24 – 2023-06-25 (×2): 3 mL
  Administered 2023-06-25 – 2023-06-26 (×3): 0 mL

## 2023-06-24 MED ORDER — ENOXAPARIN 40 MG/0.4 ML SUBCUTANEOUS SYRINGE
40.0000 mg | INJECTION | Freq: Every day | SUBCUTANEOUS | Status: DC
Start: 2023-06-24 — End: 2023-06-26
  Administered 2023-06-24 – 2023-06-26 (×3): 40 mg via SUBCUTANEOUS
  Filled 2023-06-24 (×2): qty 0.4

## 2023-06-24 MED ORDER — LORAZEPAM 2 MG/ML INJECTION WRAPPER
0.5000 mg | INTRAMUSCULAR | Status: AC
Start: 2023-06-24 — End: 2023-06-24
  Administered 2023-06-24: 0.5 mg via INTRAVENOUS

## 2023-06-24 MED ORDER — PROMETHAZINE 25 MG RECTAL SUPPOSITORY
RECTAL | Status: AC
Start: 2023-06-24 — End: 2023-06-24
  Filled 2023-06-24: qty 1

## 2023-06-24 MED ORDER — PROMETHAZINE 25 MG RECTAL SUPPOSITORY
25.0000 mg | RECTAL | Status: DC
Start: 2023-06-24 — End: 2023-06-26
  Administered 2023-06-24: 0 mg via RECTAL

## 2023-06-24 MED ORDER — LORAZEPAM 2 MG/ML INJECTION SYRINGE
INJECTION | INTRAMUSCULAR | Status: AC
Start: 2023-06-24 — End: 2023-06-24
  Filled 2023-06-24: qty 1

## 2023-06-24 MED ORDER — LOSARTAN 50 MG TABLET
ORAL_TABLET | ORAL | Status: AC
Start: 2023-06-24 — End: 2023-06-24
  Filled 2023-06-24: qty 1

## 2023-06-24 MED ORDER — FAMOTIDINE 20 MG TABLET
40.0000 mg | ORAL_TABLET | Freq: Two times a day (BID) | ORAL | Status: DC
Start: 2023-06-24 — End: 2023-06-26
  Administered 2023-06-24 – 2023-06-26 (×4): 40 mg via ORAL
  Filled 2023-06-24 (×3): qty 2

## 2023-06-24 MED ORDER — FAMOTIDINE 20 MG TABLET
ORAL_TABLET | ORAL | Status: AC
Start: 2023-06-24 — End: 2023-06-24
  Filled 2023-06-24: qty 2

## 2023-06-24 MED ORDER — PANTOPRAZOLE 40 MG TABLET,DELAYED RELEASE
DELAYED_RELEASE_TABLET | ORAL | Status: AC
Start: 2023-06-24 — End: 2023-06-24
  Filled 2023-06-24: qty 1

## 2023-06-24 MED ORDER — PROCHLORPERAZINE EDISYLATE 10 MG/2 ML (5 MG/ML) INJECTION SOLUTION
10.0000 mg | INTRAMUSCULAR | Status: AC
Start: 2023-06-24 — End: 2023-06-24
  Administered 2023-06-24: 10 mg via INTRAVENOUS

## 2023-06-24 NOTE — ED Nurses Note (Signed)
PT RESTING QUIETLY, NO APPARENT DISTRESS NOTED.

## 2023-06-24 NOTE — ED Triage Notes (Signed)
Reports having "episodes" of dizziness with "shaking all over" elevated HR, "feeling out of sorts", weakness.

## 2023-06-24 NOTE — Respiratory Therapy (Signed)
06/24/23 1248   Blood Gas Puncture   Start Time 1238   Blood Gas Type Arterial   Arterial Site right;radial artery   Collateral Circulation Verified Allen's Test   Site Preparation alcohol   Pressure Held yes   Distal Pulse Present yes   Hematoma Present no   Patient Tolerance Good   Sample Obtained/Sent to Lab yes   Oxygen Amount FiO2 (specify)  (21)   Stop Time 1249   Test Duration 11 minutes   $ RT ABG Puncture Charge ([facility],[place],[facility])  ABG Puncture Performed

## 2023-06-24 NOTE — ED APP Handoff Note (Signed)
[facility]  Emergency Department  Provider in Triage Note    Name: Yesenia Walker  Age: 62 y.o.  Gender: female     Subjective:   Yesenia Walker is a 62 y.o. female who presents with complaint of Rapid Heart Rate, Nausea, Vomiting, Shaking, and Dizziness  .  Pt presents to the Er for evaluation of n/v, elevated hr, tremors x 3 days. Pt states her oxygen has been fluctuating up and down. Pt has had episodes on and off over the past 4 days. Pt states [facility]. She has seen neurology and pulmonology.     Objective:   Filed Vitals:    06/24/23 1226   BP: (!) 137/98   Pulse: (!) 128   Resp: 18   Temp: 36.6 C (97.8 F)   SpO2: 98%      Focused Physical Exam shows pt sitting in chair. Resp even and nonlabored. She is alert.     Assessment:  A medical screening exam was completed.  This patient is a 62 y.o. female with initial findings showing n/v, tachycardia.     Plan:  Please see initial orders and work-up below.  This is to be continued with full evaluation in the main Emergency Department.     No current facility-administered medications for this encounter.     No results found for this or any previous visit (from the past 24 hour(s)).     Roma Kayser, APRN  06/24/2023, 12:24

## 2023-06-24 NOTE — ED Nurses Note (Signed)
Pt to ER 13 via triage. C/o shakiness, dizziness, fast heart rate, and nausea. Pt states that she been to the ER before for these episodes, but states that they haven't found anything. Pt states that she has also had an MRI of brain done d/t shakiness just waiting for the results. Pt is awake and oriented x4. Pt appears anxious. NAD noted. Pt placed in gown and placed on bedside monitor (cardiac, BP, pulse ox). Pt is sinus tachycardia on monitor otherwise VSS at this time. Family at bedside call light within reach.

## 2023-06-24 NOTE — ED Nurses Note (Signed)
Patient refused phenergan suppository. Reports that she is no longer nauseated and was able to take a few sips of her ginger ale.

## 2023-06-24 NOTE — ED Nurses Note (Signed)
Pt unable to keep arm straight to allow IV fluids to run in. After multiple attempts, fluids placed on pump at this time.

## 2023-06-24 NOTE — ED Provider Notes (Signed)
[facility]  ED Primary Provider Note  Patient Name: Yesenia Walker  Patient Age: 62 y.o.  Date of Birth: [birth year]    Chief Complaint: Rapid Heart Rate, Nausea, Vomiting, Shaking, and Dizziness        History of Present Illness       Yesenia Walker is a 62 y.o. female who had concerns including Rapid Heart Rate, Nausea, Vomiting, Shaking, and Dizziness.  This patient is a 62 year old female who presents with reported tachycardia, in addition to general malaise.  Patient has experienced similar symptoms 4-5 times over the prior year.  She has been referred to see pulmonology, for intermittent hypoxia requiring brief O2 at home, which she no longer requires.  She was also been referred to Neurology to evaluate her tremors.  Patient reports 2 episodes of emesis.        Review of Systems     No other overt Review of Systems are noted to be positive except noted in the HPI.      Historical Data   History Reviewed This Encounter: Medical History  Surgical History  Family History  Social History      Physical Exam   ED Triage Vitals [06/24/23 1226]   BP (Non-Invasive) (!) 137/98   Heart Rate (!) 128   Respiratory Rate 18   Temperature 36.6 C (97.8 F)   SpO2 98 %   Weight 93 kg (205 lb)   Height 1.6 m (5\' 3" )         Nursing notes reviewed for what could be assessed. Past Medical, Surgical, and Social history reviewed for what has been completed.    Constitutional:  Patient appears uncomfortable, holding a wash rag. Well-Developed. Well Nourished.  Head: Normocephalic, atraumatic.  Mouth/Throat:  Symmetric facial movement.  Eyes: EOM grossly intact, conjunctiva normal.  Neck: Supple  Cardiovascular:  Tachycardic Rate and Rhythm, extremities well perfused.  Pulmonary/Chest: No respiratory distress. Lungs are symmetric to auscultation bilaterally.  Abdominal: Soft, non-tender, non-distended. Non peritoneal, no rebound, no guarding.  MSK: No Lower Extremity Edema.  Skin: Warm, dry,  and intact  Neuro: Appropriate, CN II-XII grossly intact.  No substantial difficulty with ambulation.  Intermittent tremor noted.  Psych: Pleasant              Procedures      Patient Data     Labs Ordered/Reviewed   BASIC METABOLIC PANEL - Abnormal; Notable for the following components:       Result Value    GLUCOSE 123 (*)     All other components within normal limits    Narrative:     Estimated Glomerular Filtration Rate (eGFR) is calculated using the CKD-EPI (2021) equation, intended for patients 72 years of age and older. If gender is not documented or "unknown", there will be no eGFR calculation.     HEPATIC FUNCTION PANEL - Abnormal; Notable for the following components:    GLOBULIN 2.8 (*)     ALBUMIN/GLOBULIN RATIO 1.7 (*)     All other components within normal limits   ARTERIAL BLOOD GAS/LACTATE - Abnormal; Notable for the following components:    PO2 (ARTERIAL) 62 (*)     All other components within normal limits   CBC WITH DIFF - Abnormal; Notable for the following components:    HGB 15.7 (*)     HCT 45.9 (*)     All other components within normal limits   URINALYSIS, MACROSCOPIC - Abnormal; Notable for the  following components:    LEUKOCYTES 25 (*)     PROTEIN 50 (*)     KETONES 20 (*)     All other components within normal limits   URINALYSIS, MICROSCOPIC - Abnormal; Notable for the following components:    MUCOUS Moderate (*)     WBCS 6 (*)     All other components within normal limits   LACTIC ACID LEVEL W/ REFLEX FOR LEVEL >2.0 - Normal   LIPASE - Normal   MAGNESIUM - Normal   THYROID STIMULATING HORMONE (SENSITIVE TSH) - Normal   TROPONIN-I - Normal   ADULT ROUTINE BLOOD CULTURE, SET OF 2 BOTTLES (BACTERIA AND YEAST)   ADULT ROUTINE BLOOD CULTURE, SET OF 2 BOTTLES (BACTERIA AND YEAST)   URINE CULTURE,ROUTINE   CBC/DIFF    Narrative:     The following orders were created for panel order CBC/DIFF.  Procedure                               Abnormality         Status                     ---------                                -----------         ------                     CBC WITH GEXB[[id number]]                Abnormal            Final result                 Please view results for these tests on the individual orders.   URINALYSIS, MACROSCOPIC AND MICROSCOPIC W/CULTURE REFLEX    Narrative:     The following orders were created for panel order URINALYSIS, MACROSCOPIC AND MICROSCOPIC W/CULTURE REFLEX.  Procedure                               Abnormality         Status                     ---------                               -----------         ------                     URINALYSIS, MACROSCOPIC[[id number]]      Abnormal            Final result               URINALYSIS, MICROSCOPIC[[id number]]      Abnormal            Final result                 Please view results for these tests on the individual orders.   EXTRA TUBES    Narrative:     The following orders were created for panel order EXTRA TUBES.  Procedure  Abnormality         Status                     ---------                               -----------         ------                     BLUE TOP JYNW[[id number]]                                    Final result               GOLD TOP MVHQ[[id number]]                                    Final result                 Please view results for these tests on the individual orders.   BLUE TOP TUBE   GOLD TOP TUBE       CTA CHEST FOR PULMONARY EMBOLUS AND CT ABD/PEL W IV CONTRAST   Final Result by Edi, Radresults In (07/21 1404)   NEGATIVE EVALUATION FOR ACUTE PULMONARY EMBOLISM      CLEAR LUNGS      NO ACUTE FINDINGS IN THE ABDOMEN AND PELVIS ON CONTRAST-ENHANCED CT         One or more dose reduction techniques were used (e.g., Automated exposure control, adjustment of the mA and/or kV according to patient size, use of iterative reconstruction technique).         Radiologist location ID: [id number]         CT BRAIN WO IV CONTRAST   Final Result by Edi, Radresults In (07/21 1345)   NO ACUTE  FINDINGS         One or more dose reduction techniques were used (e.g., Automated exposure control, adjustment of the mA and/or kV according to patient size, use of iterative reconstruction technique).         Radiologist location ID: [id number]             Medical Decision Making          Medical Decision Making        Studies Assessed and/or Ordered:  Lab, EKG, radiology    EKG:   This EKG interpreted by me shows:    Rate:  124 beats per minute     Interpretation:  PR 166, No consistent ST Elevation, No Acute STEMI Identified.      MDM Narrative:  This patient is a 62 year old female who presents with malaise, tachycardia, and vomiting.  She has been seen by multiple outpatient providers including specialists regarding her complaints, with no clear etiology.  Patient was worked up, with no red flag findings noted on radiography.  Laboratory analysis also showed no red flag findings.  The patient did have continued discomfort/difficulty tolerating oral intake, for which hospitalist was contacted for admission evaluation.                 ED Course as of 06/24/23 2332   Sun Jun 24, 2023   1747 Dr. Eben Burow contacted.  Medications Administered in the ED   NS flush syringe (0 mL Intracatheter Not Given 06/24/23 1315)   NS flush syringe (has no administration in time range)   NS flush syringe (0 mL Intracatheter Not Given 06/24/23 1315)   NS flush syringe (has no administration in time range)   NS bolus infusion 30 mL/kg (Ideal) = 1,572 mL (0 mL Intravenous Stopped 06/24/23 1730)   prochlorperazine (COMPAZINE) 5 mg/mL injection (10 mg Intravenous Given 06/24/23 1317)   diphenhydrAMINE (BENADRYL) 50 mg/mL injection (12.5 mg Intravenous Given 06/24/23 1317)   iohexol (OMNIPAQUE 350) infusion (100 mL Intravenous Given 06/24/23 1341)       Patient will be admitted to the  service for further workup and management.    Disposition: Admitted             Clinical Impression   Nausea (Primary)   Tachycardia   Tremor          Current Discharge Medication List            /R. Tobey Bride, MD, [facility]  Department of Emergency Medicine  [facility]

## 2023-06-24 NOTE — H&P (Addendum)
[facility]  Admission H&P      Date of Service:  06/24/2023  Yesenia Walker y.o. female  Date of Admission:  06/24/2023  Date of Birth:  1960/12/13      Chief Complaint:  tremors    HPI: Yesenia Walker is a 62 y.o., White female who presents with depression, hypertension, mitral wall prolapse, tremors, migraines came to ED due to nausea and tremors.  Patient reports that she started to feel nauseous 5 days ago, which was getting worse, and she started to have tremors today morning.  She did vomit couple of times today and yesterday.      Patient has been admitted here in the past for similar complaints, she was seeing neurologist outpatient, had MRI of brain at [facility] last week, results are pending.    Patient reports that she gets these episodes once every 2-3 months, which starts with nausea, and she gets the tremors.  She complains of mild headaches.    In the ED shows sinus tachycardic to 110s, blood pressure stable, on room air. She was c/o mild headache, blurry vision, nausea, dizziness,     History:    Past Medical:    Past Medical History:   Diagnosis Date    Acute respiratory failure with hypoxemia (CMS HCC)     Depression     Enterostenosis (CMS HCC)     Esophageal reflux     HTN (hypertension)     Hyperlipidemia     Migraines     Mitral valve prolapse     Murmur, cardiac     Shortness of breath      Past Surgical:    Past Surgical History:   Procedure Laterality Date    BREAST MASS EXCISION      HX BREAST BIOPSY Right     NEGATIVE    HX TUBAL LIGATION      INCONTINENCE SURGERY      SHOULDER SURGERY       Family:    Family Medical History:       Problem Relation (Age of Onset)    Breast Cancer Maternal Aunt    No Known Problems Mother, Father, Sister, Brother, Maternal Grandmother, Maternal Grandfather, Paternal Grandmother, Paternal Grandfather, Daughter, Son, Maternal Uncle, Paternal Aunt, Paternal Uncle, Other          Social:   reports that she has never smoked.  She has never been exposed to tobacco smoke. She has never used smokeless tobacco. She reports that she does not drink alcohol and does not use drugs.    Allergies   Allergen Reactions    Trimethoprim Diarrhea and Nausea/ Vomiting     Other Reaction(s): Not available, vomiting    Ultram [Tramadol] Itching     Medications Prior to Admission       Prescriptions    estradioL (ESTRACE) 0.01 % (0.1 mg/gram) Vaginal Cream    Insert 1 g into the vagina Every MON and THURS    famotidine (PEPCID) 40 mg Oral Tablet    Take 1 Tablet (40 mg total) by mouth Twice daily    fremanezumab-vfrm (AJOVY AUTOINJECTOR) 225 mg/1.5 mL Subcutaneous Auto-Injector    Inject 1.5 mL (225 mg total) under the skin Every 30 days    losartan (COZAAR) 50 mg Oral Tablet    Take 1 Tablet (50 mg total) by mouth Once a day    metoprolol succinate (TOPROL-XL) 25 mg Oral Tablet Sustained Release 24 hr  Take 1 Tablet (25 mg total) by mouth Once a day    MYRBETRIQ 25 mg Oral Tablet Sustained Release 24 hr    Take 1 Tablet (25 mg total) by mouth Once a day    pantoprazole (PROTONIX) 40 mg Oral Tablet, Delayed Release (E.C.)    Take 1 Tablet (40 mg total) by mouth Once a day    sucralfate (CARAFATE) 1 gram Oral Tablet    Take 1 Tablet (1 g total) by mouth Twice a day before meals    ubrogepant (UBRELVY) 50 mg Oral Tablet    Take by mouth    venlafaxine (EFFEXOR XR) 75 mg Oral Capsule, Sust. Release 24 hr    Take 1 Capsule (75 mg total) by mouth Once a day          NS flush syringe, 3 mL, Intracatheter, Q8HRS  NS flush syringe, 3 mL, Intracatheter, Q1H PRN  NS flush syringe, 3 mL, Intracatheter, Q8HRS  NS flush syringe, 3 mL, Intracatheter, Q1H PRN  promethazine (PHENERGAN) rectal suppository, 25 mg, Rectal, Now        ROS:   Review of Systems   Constitutional:  Negative for appetite change, chills, diaphoresis, fatigue and fever.   HENT:  Negative for congestion, sore throat and trouble swallowing.    Eyes:  Positive for visual disturbance. Negative for  pain and discharge.   Respiratory:  Negative for cough, chest tightness, shortness of breath and wheezing.    Cardiovascular:  Negative for chest pain, palpitations and leg swelling.   Gastrointestinal:  Positive for nausea and vomiting. Negative for abdominal distention, abdominal pain, constipation and diarrhea.   Genitourinary:  Negative for difficulty urinating, dysuria, flank pain, hematuria and urgency.   Musculoskeletal:  Negative for back pain, gait problem and neck pain.   Skin:  Negative for color change and rash.   Neurological:  Positive for dizziness, tremors and headaches. Negative for speech difficulty, weakness, light-headedness and numbness.   Psychiatric/Behavioral:  Negative for agitation and confusion.        All other systems negative unless marked.       Exam:  Vitals:    06/24/23 1515 06/24/23 1530 06/24/23 1545 06/24/23 1600   BP: (!) 150/88 (!) 145/94 (!) 147/98    Pulse: (!) 117 (!) 112 (!) 101 (!) 121   Resp: (!) 23 (!) 27 18 (!) 22   Temp:       SpO2: 92% 94% 96% 95%   Weight:       Height:       BMI:                 Physical Exam  Vitals and nursing note reviewed.   Constitutional:       General: She is not in acute distress.     Appearance: She is well-developed.   HENT:      Head: Normocephalic and atraumatic.   Eyes:      Conjunctiva/sclera: Conjunctivae normal.   Cardiovascular:      Rate and Rhythm: Normal rate and regular rhythm.      Heart sounds: No murmur heard.  Pulmonary:      Effort: Pulmonary effort is normal. No respiratory distress.      Breath sounds: Normal breath sounds.   Abdominal:      Palpations: Abdomen is soft.      Tenderness: There is no abdominal tenderness.   Musculoskeletal:         General: No swelling.  Cervical back: Neck supple.   Skin:     General: Skin is warm and dry.      Capillary Refill: Capillary refill takes less than 2 seconds.   Neurological:      Mental Status: She is alert.   Psychiatric:         Mood and Affect: Mood normal.              Labs:     Results for orders placed or performed during the hospital encounter of 06/24/23 (from the past 24 hour(s))   CBC/DIFF    Narrative    The following orders were created for panel order CBC/DIFF.  Procedure                               Abnormality         Status                     ---------                               -----------         ------                     CBC WITH DDUK[[id number]]                Abnormal            Final result                 Please view results for these tests on the individual orders.   BASIC METABOLIC PANEL   Result Value Ref Range    SODIUM 143 136 - 145 mmol/L    POTASSIUM 3.9 3.5 - 5.1 mmol/L    CHLORIDE 107 98 - 107 mmol/L    CO2 TOTAL 27 21 - 31 mmol/L    ANION GAP 9 4 - 13 mmol/L    CALCIUM 9.6 8.6 - 10.3 mg/dL    GLUCOSE 376 (H) 74 - 109 mg/dL    BUN 13 7 - 25 mg/dL    CREATININE 2.83 1.51 - 1.30 mg/dL    BUN/CREA RATIO 15 6 - 22    ESTIMATED GFR 78 >59 mL/min/1.22m^2    OSMOLALITY, CALCULATED 287 270 - 290 mOsm/kg    Narrative    Estimated Glomerular Filtration Rate (eGFR) is calculated using the CKD-EPI (2021) equation, intended for patients 12 years of age and older. If gender is not documented or "unknown", there will be no eGFR calculation.     HEPATIC FUNCTION PANEL   Result Value Ref Range    ALBUMIN 4.7 3.5 - 5.7 g/dL    ALKALINE PHOSPHATASE 66 34 - 104 U/L    ALT (SGPT) 39 7 - 52 U/L    AST (SGOT) 28 13 - 39 U/L    BILIRUBIN TOTAL 0.6 0.3 - 1.2 mg/dL    BILIRUBIN, INDIRECT 0.48 <=1 mg/dL    PROTEIN TOTAL 7.5 6.4 - 8.9 g/dL    GLOBULIN 2.8 (L) 2.9 - 5.4    ALBUMIN/GLOBULIN RATIO 1.7 (H) 0.8 - 1.4    BILIRUBIN DIRECT 0.12 <=0.20 md/dL   LACTIC ACID LEVEL W/ REFLEX FOR LEVEL >2.0   Result Value Ref Range    LACTIC ACID 1.6 0.5 - 2.2 mmol/L   URINALYSIS, MACROSCOPIC AND MICROSCOPIC W/CULTURE REFLEX  Specimen: Urine, Site not specified    Narrative    The following orders were created for panel order URINALYSIS, MACROSCOPIC AND MICROSCOPIC W/CULTURE  REFLEX.  Procedure                               Abnormality         Status                     ---------                               -----------         ------                     URINALYSIS, MACROSCOPIC[[id number]]      Abnormal            Final result               URINALYSIS, MICROSCOPIC[[id number]]      Abnormal            Final result                 Please view results for these tests on the individual orders.   ARTERIAL BLOOD GAS/LACTATE   Result Value Ref Range    PH (ARTERIAL) 7.42 7.35 - 7.45    PCO2 (ARTERIAL) 41 35 - 45 mm/Hg    BICARBONATE (ARTERIAL) 25.7 20.0 - 26.0 mmol/L    BASE EXCESS (ARTERIAL) 1.8 0.0 - 2.0 mmol/L    MET-HEMOGLOBIN 0.3 <=2.0 %    LACTATE      CARBOXYHEMOGLOBIN 1.4 <=1.5 %    O2CT 20.5 %    %FIO2 (ARTERIAL) 21 %    PO2 (ARTERIAL) 62 (L) 80 - 100 mm/Hg    OXYHEMOGLOBIN 91.3 88.0 - 100.0 %    ALLEN TEST yes     DRAW SITE RR    CBC WITH DIFF   Result Value Ref Range    WBC 8.1 3.8 - 11.8 x10^3/uL    RBC 4.83 3.63 - 4.92 x10^6/uL    HGB 15.7 (H) 10.9 - 14.3 g/dL    HCT 56.4 (H) 33.2 - 41.9 %    MCV 94.9 75.5 - 95.3 fL    MCH 32.5 24.7 - 32.8 pg    MCHC 34.2 32.3 - 35.6 g/dL    RDW 95.1 88.4 - 16.6 %    PLATELETS 274 140 - 440 x10^3/uL    MPV 9.3 7.9 - 10.8 fL    NEUTROPHIL % 74 43 - 77 %    LYMPHOCYTE % 20 16 - 44 %    MONOCYTE % 5 5 - 13 %    EOSINOPHIL % 1 %    BASOPHIL % 0 0 - 1 %    NEUTROPHIL # 6.00 1.85 - 7.80 x10^3/uL    LYMPHOCYTE # 1.70 1.00 - 3.00 x10^3/uL    MONOCYTE # 0.40 0.30 - 1.00 x10^3/uL    EOSINOPHIL # 0.00 0.00 - 0.50 x10^3/uL    BASOPHIL # 0.00 0.00 - 0.10 x10^3/uL   URINALYSIS, MACROSCOPIC   Result Value Ref Range    COLOR Yellow Colorless, Light Yellow, Yellow    APPEARANCE Clear Clear    SPECIFIC GRAVITY 1.027 1.002 - 1.030    PH 6.5 5.0 - 9.0    LEUKOCYTES 25 (A) Negative, 100  WBCs/uL    NITRITE Negative Negative    PROTEIN 50 (A) Negative, 10 , 20  mg/dL    GLUCOSE Negative Negative, 30  mg/dL    KETONES 20 (A) Negative, Trace mg/dL    BILIRUBIN Negative  Negative, 0.5 mg/dL    BLOOD Negative Negative, 0.03 mg/dL    UROBILINOGEN Normal Normal mg/dL   URINALYSIS, MICROSCOPIC   Result Value Ref Range    MUCOUS Moderate (A) (none) /hpf    RBCS <1 <4 /hpf    WBCS 6 (H) <6 /hpf    SQUAMOUS EPITHELIAL 7 <28 /hpf   LIPASE   Result Value Ref Range    LIPASE 17 11 - 82 U/L   EXTRA TUBES    Narrative    The following orders were created for panel order EXTRA TUBES.  Procedure                               Abnormality         Status                     ---------                               -----------         ------                     BLUE TOP ZOXW[[id number]]                                    Final result               GOLD TOP JXBJ[[id number]]                                    Final result                 Please view results for these tests on the individual orders.   BLUE TOP TUBE   Result Value Ref Range    RAINBOW/EXTRA TUBE AUTO RESULT Yes    GOLD TOP TUBE   Result Value Ref Range    RAINBOW/EXTRA TUBE AUTO RESULT Yes         Imaging Studies:    CTA CHEST FOR PULMONARY EMBOLUS AND CT ABD/PEL W IV CONTRAST   Final Result   NEGATIVE EVALUATION FOR ACUTE PULMONARY EMBOLISM      CLEAR LUNGS      NO ACUTE FINDINGS IN THE ABDOMEN AND PELVIS ON CONTRAST-ENHANCED CT         One or more dose reduction techniques were used (e.g., Automated exposure control, adjustment of the mA and/or kV according to patient size, use of iterative reconstruction technique).         Radiologist location ID: [id number]         CT BRAIN WO IV CONTRAST   Final Result   NO ACUTE FINDINGS         One or more dose reduction techniques were used (e.g., Automated exposure control, adjustment of the mA and/or kV according to patient size, use of iterative reconstruction technique).         Radiologist location ID: [id number]  DNR Status:  Prior    Assessment/Plan:   There are no active hospital problems to display for this patient.      # Sporadic episodes of tremors, etiology under work  up  - With associated Blurry vision, hot flashes, nausea, vomiting, headaches  - differentials include Functional tremor, Anxiety, Migraines, evaluate for Psychiatric causes (conversion disorder)  - get [facility] brain report from [facility]  - will give ativan one dose  - Zofran prn for nausea  - resume home dose of Beta blocker  - if symptoms unresolved, consult tele Neurology tomorrow  - If MRI brain negative, may consider psych eval      # Hypertension  - Metoprolol ER 25  - Losartan 50 mg daily    # GERD  - Protonix       # Depression  - Venlafaxine 75 QD      DVT/PE Prophylaxis: Lovenox  GI ppx; Protonix      Genella Mech, MD    This note was partially generated using MModal Fluency Direct system, and there may be some incorrect words, spellings, and punctuation that were not noted in checking the note before saving.

## 2023-06-25 DIAGNOSIS — N390 Urinary tract infection, site not specified: Secondary | ICD-10-CM

## 2023-06-25 DIAGNOSIS — G4733 Obstructive sleep apnea (adult) (pediatric): Secondary | ICD-10-CM

## 2023-06-25 DIAGNOSIS — J9601 Acute respiratory failure with hypoxia: Secondary | ICD-10-CM

## 2023-06-25 DIAGNOSIS — R112 Nausea with vomiting, unspecified: Secondary | ICD-10-CM

## 2023-06-25 DIAGNOSIS — R9431 Abnormal electrocardiogram [ECG] [EKG]: Secondary | ICD-10-CM

## 2023-06-25 DIAGNOSIS — I341 Nonrheumatic mitral (valve) prolapse: Secondary | ICD-10-CM

## 2023-06-25 DIAGNOSIS — R000 Tachycardia, unspecified: Secondary | ICD-10-CM

## 2023-06-25 DIAGNOSIS — F419 Anxiety disorder, unspecified: Secondary | ICD-10-CM

## 2023-06-25 LAB — BASIC METABOLIC PANEL
ANION GAP: 5 mmol/L (ref 4–13)
BUN/CREA RATIO: 17 (ref 6–22)
BUN: 9 mg/dL (ref 7–25)
CALCIUM: 8 mg/dL — ABNORMAL LOW (ref 8.6–10.3)
CHLORIDE: 110 mmol/L — ABNORMAL HIGH (ref 98–107)
CO2 TOTAL: 26 mmol/L (ref 21–31)
CREATININE: 0.54 mg/dL — ABNORMAL LOW (ref 0.60–1.30)
ESTIMATED GFR: 105 mL/min/{1.73_m2} (ref >59)
GLUCOSE: 104 mg/dL (ref 74–109)
OSMOLALITY, CALCULATED: 280 mOsm/kg (ref 270–290)
POTASSIUM: 3.7 mmol/L (ref 3.5–5.1)
SODIUM: 141 mmol/L (ref 136–145)

## 2023-06-25 LAB — ECG 12 LEAD
Atrial Rate: 125 {beats}/min
Calculated P Axis: 40 degrees
Calculated R Axis: 7 degrees
Calculated T Axis: 10 degrees
PR Interval: 166 ms
QRS Duration: 76 ms
QT Interval: 294 ms
QTC Calculation: 422 ms
Ventricular rate: 124 {beats}/min

## 2023-06-25 LAB — CBC WITH DIFF
BASOPHIL #: 0 10*3/uL (ref 0.00–0.10)
BASOPHIL %: 1 % (ref 0–1)
EOSINOPHIL #: 0 10*3/uL (ref 0.00–0.50)
EOSINOPHIL %: 0 % — ABNORMAL LOW
HCT: 37.6 % (ref 31.2–41.9)
HGB: 12.8 g/dL (ref 10.9–14.3)
LYMPHOCYTE #: 1.3 10*3/uL (ref 1.00–3.00)
LYMPHOCYTE %: 20 % (ref 16–44)
MCH: 32.3 pg (ref 24.7–32.8)
MCHC: 34.1 g/dL (ref 32.3–35.6)
MCV: 94.6 fL (ref 75.5–95.3)
MONOCYTE #: 0.4 10*3/uL (ref 0.30–1.00)
MONOCYTE %: 6 % (ref 5–13)
MPV: 9.4 fL (ref 7.9–10.8)
NEUTROPHIL #: 4.6 10*3/uL (ref 1.85–7.80)
NEUTROPHIL %: 74 % (ref 43–77)
PLATELETS: 222 10*3/uL (ref 140–440)
RBC: 3.97 10*6/uL (ref 3.63–4.92)
RDW: 12.2 % — ABNORMAL LOW (ref 12.3–17.7)
WBC: 6.3 10*3/uL (ref 3.8–11.8)

## 2023-06-25 LAB — MAGNESIUM: MAGNESIUM: 1.8 mg/dL — ABNORMAL LOW (ref 1.9–2.7)

## 2023-06-25 MED ORDER — SODIUM CHLORIDE 0.9 % INTRAVENOUS SOLUTION
INTRAVENOUS | Status: DC
Start: 2023-06-25 — End: 2023-06-26

## 2023-06-25 MED ORDER — SODIUM CHLORIDE 0.9 % INTRAVENOUS PIGGYBACK
1.0000 g | INTRAVENOUS | Status: DC
Start: 2023-06-25 — End: 2023-06-26
  Administered 2023-06-25: 0 g via INTRAVENOUS
  Administered 2023-06-25 – 2023-06-26 (×2): 1 g via INTRAVENOUS
  Administered 2023-06-26: 0 g via INTRAVENOUS
  Filled 2023-06-25 (×2): qty 10

## 2023-06-25 MED ORDER — ACETAMINOPHEN 325 MG TABLET
650.0000 mg | ORAL_TABLET | ORAL | Status: DC | PRN
Start: 2023-06-25 — End: 2023-06-26
  Administered 2023-06-25: 650 mg via ORAL
  Filled 2023-06-25: qty 2

## 2023-06-25 NOTE — ED Nurses Note (Signed)
REPORT GIVEN TO LISA, RN

## 2023-06-25 NOTE — Nurses Notes (Signed)
Patient arrived to room 303A, ambulated to bed. Oriented to room, vitals taken, skin assessment performed and admission questions done.Patient complaining of nausea, medication given.

## 2023-06-25 NOTE — Care Plan (Signed)
Problem: Adult Inpatient Plan of Care  Goal: Plan of Care Review  Outcome: Ongoing (see interventions/notes)  Goal: Patient-Specific Goal (Individualized)  Outcome: Ongoing (see interventions/notes)  Flowsheets (Taken 06/25/2023 0000)  Individualized Care Needs: no  Anxieties, Fears or Concerns: no  Patient-Specific Goals (Include Timeframe): better control of migrains  Goal: Absence of Hospital-Acquired Illness or Injury  Outcome: Ongoing (see interventions/notes)  Goal: Optimal Comfort and Wellbeing  Outcome: Ongoing (see interventions/notes)  Goal: Rounds/Family Conference  Outcome: Ongoing (see interventions/notes)     Problem: Pain Acute  Goal: Optimal Pain Control and Function  Outcome: Ongoing (see interventions/notes)

## 2023-06-25 NOTE — Progress Notes (Addendum)
MEDICINE [facility]    HOSPITALIST PROGRESS NOTE    Yesenia Walker  Date of service: 06/25/2023  Date of Admission:  06/24/2023  Hospital Day:  LOS: 0 days     Subjective:     Patient was seen and evaluated by the bedside.  She reported improvement in tremors.      Urinalysis positive.  Follow up urine cultures.  Continue ceftriaxone      Vital Signs:  Filed Vitals:    06/25/23 0849 06/25/23 0957 06/25/23 1210 06/25/23 1602   BP: 121/62  (!) 112/54 105/61   Pulse: 73 72 67 74   Resp: 18  16 18    Temp: 36.4 C (97.6 F)  36.8 C (98.2 F) 36.9 C (98.5 F)   SpO2: 93%  95% 94%        Physical Exam:  General:  Patient in NAD, resting in bed, no visitors present  Head:  Normocephalic, atraumatic  Eyes:  PERRL, anicteric sclera  ENT:  Oral mucosa moist, no nasal discharge   Neck:  Soft, supple, trachea midline  Heart:  RRR, S1 and S2 normal  Lungs:  Unlabored respirations.  Lungs are clear to auscultation bilaterally, with no wheezes, no rales, no conversational dyspnea  Abdomen:  Soft, active bowel sounds, non-tender to palpation, non-distended  Extremities:  Pulses equal bilaterally.  Capillary refill less than 3 seconds.  No edema in lower extremities bilaterally   Skin:  Warm and dry, not diaphoretic.  No ecchymosis noted.   Neuro:  A&O x 3.  No focal deficits.  Speech intact  Psych:  Cooperative, not agitated    Intake & Output:    Intake/Output Summary (Last 24 hours) at 06/25/2023 1903  Last data filed at 06/25/2023 1714  Gross per 24 hour   Intake 290 ml   Output --   Net 290 ml     I/O current shift:  No intake/output data recorded.  Emesis:    BM:    Date of Last Bowel Movement: 06/24/23  Heme:      cefTRIAXone (ROCEPHIN) 1 g in NS 50 mL IVPB minibag, 1 g, Intravenous, Q24H  enoxaparin PF (LOVENOX) 40 mg/0.4 mL SubQ injection, 40 mg, Subcutaneous, Daily  famotidine (PEPCID) tablet, 40 mg, Oral, 2x/day  losartan (COZAAR) tablet, 50 mg, Oral, Daily  metoprolol succinate (TOPROL-XL) 24 hr  extended release tablet, 25 mg, Oral, Daily  NS flush syringe, 3 mL, Intracatheter, Q8HRS  NS flush syringe, 3 mL, Intracatheter, Q1H PRN  NS flush syringe, 3 mL, Intracatheter, Q8HRS  NS flush syringe, 3 mL, Intracatheter, Q1H PRN  NS premix infusion, , Intravenous, Continuous  ondansetron (ZOFRAN) 2 mg/mL injection, 4 mg, Intravenous, Q6H PRN  pantoprazole (PROTONIX) delayed release tablet, 40 mg, Oral, Daily  promethazine (PHENERGAN) rectal suppository, 25 mg, Rectal, Now  sucralfate (CARAFATE) tablet, 1 g, Oral, 2x/day AC  venlafaxine (EFFEXOR XR) 24 hr extended release capsule, 75 mg, Oral, Daily          Labs:  Recent Results (from the past 48 hour(s))   CBC WITH DIFF    Collection Time: 06/25/23  3:34 AM   Result Value    WBC 6.3    HGB 12.8    HCT 37.6    PLATELETS 222      Results for orders placed or performed during the hospital encounter of 06/24/23 (from the past 48 hour(s))   BASIC METABOLIC PANEL    Collection Time: 06/25/23  3:34 AM  Result Value    SODIUM 141    POTASSIUM 3.7    CHLORIDE 110 (H)    CO2 TOTAL 26    GLUCOSE 104    BUN 9    CREATININE 0.54 (L)      Recent Results (from the past 48 hour(s))   LIPASE    Collection Time: 06/24/23 12:40 PM   Result Value    LIPASE 17   HEPATIC FUNCTION PANEL    Collection Time: 06/24/23 12:40 PM   Result Value    ALKALINE PHOSPHATASE 66    ALT (SGPT) 39    AST (SGOT) 28      Results for orders placed or performed during the hospital encounter of 06/24/23 (from the past 48 hour(s))   TROPONIN-I    Collection Time: 06/24/23  5:59 PM   Result Value    TROPONIN I 8      No results found for this or any previous visit (from the past 48 hour(s)).   No results found for this or any previous visit (from the past 1344 hour(s)).   Results for orders placed or performed during the hospital encounter of 06/24/23 (from the past 48 hour(s))   ARTERIAL BLOOD GAS/LACTATE    Collection Time: 06/24/23 12:35 PM   Result Value    LACTATE         Microbiology:  Hospital  Encounter on 06/24/23 (from the past 96 hour(s))   ADULT ROUTINE BLOOD CULTURE, SET OF 2 ADULT BOTTLES (BACTERIA AND YEAST)    Collection Time: 06/24/23 12:40 PM    Specimen: Blood   Culture Result Status    BLOOD CULTURE, ROUTINE No Growth 18-24 hrs. Preliminary   ADULT ROUTINE BLOOD CULTURE, SET OF 2 ADULT BOTTLES (BACTERIA AND YEAST)    Collection Time: 06/24/23 12:40 PM    Specimen: Blood   Culture Result Status    BLOOD CULTURE, ROUTINE No Growth 18-24 hrs. Preliminary   URINE CULTURE,ROUTINE    Collection Time: 06/24/23  1:49 PM    Specimen: Urine, Site not specified   Culture Result Status    URINE CULTURE No Growth Preliminary       Imaging:   ECG 12 LEAD  Sinus tachycardia  Possible Left atrial enlargement  Possible Anterolateral infarct , age undetermined  Abnormal ECG  When compared with ECG of 11-Mar-2023 13:50,  Borderline criteria for Anterior infarct are now present  Borderline criteria for Anterolateral infarct are now present  Confirmed by Earnie Larsson [phone]) on 06/25/2023 12:00:21 PM        Assessment/ Plan:   Active Hospital Problems   (*Primary Problem)    Diagnosis    *Intractable nausea and vomiting    Atelectasis    OSA (obstructive sleep apnea)    Acute respiratory failure with hypoxia (CMS HCC)    Occasional tremors    Urinary tract infection    Anxiety     This is a 62 year old female with a PMH of depression/anxiety, hypertension, mitral valve prolapse, migraines and OSA who presented to the ER with complaints of exaggerated tremors and upper extremity twitches.  She also reported some nausea and vomiting.      Patient mentioned that she has had tremors intermittently and is being worked up by a neurologist.  He just completed an MRI brain but the result is still pending.    Urinalysis is positive.          Urinary tract infection   -ceftriaxone   -follow up urine  culture  -hydration      Bilateral hand tremors   -unclear etiology   -follow up with primary neurologist       Acute  respiratory failure with hypoxia   -O2 saturation was in mid 80s on room air.  -patient reported a history of atelectasis and was on nocturnal O2  -history of OSA   -O2 saturation improved with 1-2 L O2 via NC  -encourage ambulation.  Incentive spirometry   -outpatient sleep studies  -O2 supplementation          Disposition Planning:  Home    Delight Ovens, MD  06/25/2023  [place] MEDICINE HOSPITALIST      This note was partially created using voice recognition software and is inherently subject to errors including those of syntax and "sound alike " substitutions which may escape proof reading. In such instances, original meaning may be extrapolated by contextual derivation.

## 2023-06-26 LAB — CBC
HCT: 34.8 % (ref 31.2–41.9)
HGB: 11.9 g/dL (ref 10.9–14.3)
MCH: 32.4 pg (ref 24.7–32.8)
MCHC: 34.3 g/dL (ref 32.3–35.6)
MCV: 94.4 fL (ref 75.5–95.3)
MPV: 9.3 fL (ref 7.9–10.8)
PLATELETS: 180 10*3/uL (ref 140–440)
RBC: 3.69 10*6/uL (ref 3.63–4.92)
RDW: 12.3 % (ref 12.3–17.7)
WBC: 5.7 10*3/uL (ref 3.8–11.8)

## 2023-06-26 LAB — BASIC METABOLIC PANEL
ANION GAP: 4 mmol/L (ref 4–13)
BUN/CREA RATIO: 11 (ref 6–22)
BUN: 7 mg/dL (ref 7–25)
CALCIUM: 8.1 mg/dL — ABNORMAL LOW (ref 8.6–10.3)
CHLORIDE: 111 mmol/L — ABNORMAL HIGH (ref 98–107)
CO2 TOTAL: 26 mmol/L (ref 21–31)
CREATININE: 0.65 mg/dL (ref 0.60–1.30)
ESTIMATED GFR: 100 mL/min/{1.73_m2} (ref >59)
GLUCOSE: 88 mg/dL (ref 74–109)
OSMOLALITY, CALCULATED: 279 mOsm/kg (ref 270–290)
POTASSIUM: 3.8 mmol/L (ref 3.5–5.1)
SODIUM: 141 mmol/L (ref 136–145)

## 2023-06-26 LAB — URINE CULTURE,ROUTINE: URINE CULTURE: >100000 — AB

## 2023-06-26 LAB — MAGNESIUM: MAGNESIUM: 1.9 mg/dL (ref 1.9–2.7)

## 2023-06-26 MED ORDER — CEFDINIR 300 MG CAPSULE
300.0000 mg | ORAL_CAPSULE | Freq: Two times a day (BID) | ORAL | 0 refills | Status: AC
Start: 2023-06-26 — End: 2023-06-29

## 2023-06-26 NOTE — Care Plan (Signed)
Medical Nutrition Therapy Assessment    Reason for assessment: verbal request by patient    SUBJECTIVE : Patient complains of gastritis and migraines - requesting information and nutrition tips.  Discussed GI soft diet, gastric stimulants, and common trigger foods for migraines.  Pt has been avoiding seeds and acid foods.  She reports gall bladder removal so discussed low fat options.  Reviewed sample meal plan.        Comments: questions answered - patient appreciative of discussion    Plan/Interventions : avoid gastric stimulant  Headache/food journal to look for trigger foods      Judyann Munson, RDLD

## 2023-06-26 NOTE — Care Plan (Signed)
Problem: Adult Inpatient Plan of Care  Goal: Absence of Hospital-Acquired Illness or Injury  Outcome: Ongoing (see interventions/notes)  Intervention: Identify and Manage Fall Risk  Recent Flowsheet Documentation  Taken 06/25/2023 2145 by Heywood Footman, GN  Safety Promotion/Fall Prevention: safety round/check completed  Intervention: Prevent Skin Injury  Recent Flowsheet Documentation  Taken 06/25/2023 2145 by Heywood Footman, GN  Skin Protection:   adhesive use limited   tubing/devices free from skin contact   transparent dressing maintained  Intervention: Prevent and Manage VTE (Venous Thromboembolism) Risk  Recent Flowsheet Documentation  Taken 06/25/2023 2145 by Heywood Footman, GN  VTE Prevention/Management: anticoagulant therapy maintained     Problem: Pain Acute  Goal: Optimal Pain Control and Function  Outcome: Ongoing (see interventions/notes)  Intervention: Prevent or Manage Pain  Recent Flowsheet Documentation  Taken 06/25/2023 2145 by Heywood Footman, GN  Sleep/Rest Enhancement: awakenings minimized  Bowel Elimination Promotion:   adequate fluid intake promoted   ambulation promoted  Medication Review/Management: medications reviewed  Intervention: Optimize Psychosocial Wellbeing  Recent Flowsheet Documentation  Taken 06/25/2023 2145 by Heywood Footman, GN  Diversional Activities:   television   smartphone  Supportive Measures: active listening utilized

## 2023-06-26 NOTE — Nurses Notes (Signed)
This nurse went over discharge paper work with pt. No new concerns verbalized by pt. No pain or sob to note. Pt walked down to car and left with daughter.

## 2023-06-26 NOTE — Nurses Notes (Addendum)
Release of information obtained ( hard copy on paper chart) per patient request to send records to [facility], [place], [place]. Phone number, [phone]. H&P, labs, Imaging, EKG sent to [facility] via fax. This nurse called and spoke with Efraim Kaufmann, nursing staff, at [facility]. She provided the fax number as follows; [phone]. Read back confirmed. Record sent to [facility], patient's neurologist. She does have a follow up appt. On August 22nd.

## 2023-06-26 NOTE — Care Management Notes (Signed)
Patient discharge ready today.   No DC needs identified.

## 2023-06-26 NOTE — Care Management Notes (Signed)
[facility]  Care Management Initial Evaluation    Patient Name: Yesenia Walker  Date of Birth: 08-May-1961  Sex: female  Date/Time of Admission: 06/24/2023 12:27 PM  Room/Bed: 303/A  Payor: MEDICARE / Plan: MEDICARE PART A AND B / Product Type: Medicare /   Primary Care Providers:  Mariah Milling, DO, DO (General)  Regenia Skeeter, MD, MD (Pulmonologist)    Pharmacy Info:   Preferred Pharmacy       [facility] - [address], [place] - [address]    [address] [place] [address]    Phone: [phone] Fax: [phone]    Hours: Not open 24 hours    [id number] [place], [facility] - [address] AT [facility] [address] [place]    [address] [place] [address]    Phone: [phone] Fax: [phone]    Hours: Not open 24 hours          Emergency Contact Info:   Extended Emergency Contact Information  Primary Emergency Contact: [facility]  Address: [address]           [place], [place] [address] Yesenia Walker of [place]  Home Phone: [phone]  Work Phone: [phone]  Mobile Phone: [phone]  Relation: Husband    History:   [name] is a 62 y.o., female, admitted 06/24/2023    Height/Weight: 160 cm (5\' 3" ) / 92.1 kg (203 lb 0.5 oz)     LOS: 1 day   Admitting Diagnosis: Intractable nausea and vomiting [R11.2]    Assessment:      06/26/23 0849   Assessment Details   Assessment Type Admission   Date of Care Management Update 06/26/23   Readmission   Is this a readmission? No   Insurance Information/Type   Insurance type Medicare   Employment/Financial   Patient has Prescription Coverage?  Yes        Name of Insurance Coverage for Medications Medicare and [place] Medicaid   Financial Concerns none   Living Environment   Lives With spouse   Living Arrangements house   Able to Return to Prior Arrangements yes   Living Arrangement Comments Lives with husband, Atticus Metze   Home Safety   Home Assessment: No Problems Identified   Home Accessibility  stairs (1 railing present);stairs within home;no concerns;bed and bath are not on the first floor   Home Safety Comments No safety concerns identified   Custody and Legal Status   Do you have a court appointed guardian/conservator? No   Are you an emancipated minor? No   Custody Issues? No   Paternity Affidavit Requested? No   Care Management Plan   Projected Discharge Date 06/28/23   Discharge plan discussed with: Patient   CM will evaluate for rehabilitation potential yes   Discharge Needs Assessment   Equipment Currently Used at Home none   Equipment Needed After Discharge none   Discharge Facility/Level of Care Needs Home (Patient/Family Member/other)(code 1)   Transportation Available family or friend will provide   Referral Information   Admission Type inpatient   Arrived From home or self-care         Discharge Plan:  Home (Patient/Family Member/other) (code 1)    Patient admitted from home.  Dx; Nausea and Vomiting.    On contact, patient is alert and oriented X 3.  Patient advises living at physical address [address]/[place] with husband, Yesenia Walker.  Patient reports living in house with 1 stair to enter/exit home and 16 stairs inside of home.  Patient denies this to be a barrier or safety concern to home discharge.  Patient does not use home Oxygen Therapy, does not use Assistive Devices for ambulation and does not have Home Health/Formal Supports.    Patient reports ability to manage self care without assistance.   [facility] primary healthcare with Dr. Mariah Milling.   Patient drives self and when unable, husband can drive patient.  Patient denies safety concerns or barriers with home discharge.   The patient will continue to be evaluated for developing discharge needs.     Case Manager: Clarisa Schools, MSW  Phone: [phone]

## 2023-06-26 NOTE — Discharge Summary (Signed)
[facility]  DISCHARGE SUMMARY    PATIENT NAME:  Yesenia Walker, Yesenia Walker  MRN:  [id number]  DOB:  05-08-1961    ENCOUNTER DATE:  06/24/2023  INPATIENT ADMISSION DATE: 06/25/2023  DISCHARGE DATE:  06/26/2023    ATTENDING PHYSICIAN: Delight Ovens, MD  SERVICE: PRN HOSPITALIST 1  PRIMARY CARE PHYSICIAN: Mariah Milling, DO       No lay caregiver identified.    PRIMARY DISCHARGE DIAGNOSIS: Intractable nausea and vomiting  Active Hospital Problems    Diagnosis Date Noted    Principal Problem: Intractable nausea and vomiting [R11.2] 06/24/2023    Atelectasis [J98.11] 03/29/2023    OSA (obstructive sleep apnea) [G47.33] 03/29/2023    Acute respiratory failure with hypoxia (CMS HCC) [J96.01] 03/13/2023    Occasional tremors [R25.1] 03/13/2023    Urinary tract infection [N39.0] 04/01/2022    Anxiety [F41.9] 04/09/2014      Resolved Hospital Problems   No resolved problems to display.     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        Details   cefdinir 300 mg Capsule  Commonly known as: OMNICEF   300 mg, Oral, 2  TIMES DAILY  Qty: 6 Capsule  Refills: 0            CONTINUE these medications - NO CHANGES were made during your visit.        Details   Ajovy Autoinjector 225 mg/1.5 mL Auto-Injector  Generic drug: fremanezumab-vfrm   225 mg, Subcutaneous, EVERY 30 DAYS  Refills: 0     famotidine 40 mg Tablet  Commonly known as: PEPCID   40 mg, Oral, 2 TIMES DAILY  Refills: 0     metoprolol succinate 25 mg Tablet Sustained Release 24 hr  Commonly known as: TOPROL-XL   25 mg, Oral, DAILY  Refills: 0     Myrbetriq 25 mg Tablet Sustained Release 24 hr  Generic drug: mirabegron   25 mg, Oral, DAILY  Refills: 0     pantoprazole 40 mg Tablet, Delayed Release (E.C.)  Commonly known as: PROTONIX   40 mg, Oral, DAILY  Refills: 0     sucralfate 1 gram Tablet  Commonly known as: CARAFATE   1 g, Oral, 2 TIMES DAILY BEFORE MEALS  Refills: 0     Ubrelvy 50 mg Tablet  Generic drug: ubrogepant   Oral  Refills: 0     venlafaxine 75 mg Capsule, Sust. Release 24 hr  Commonly known  as: EFFEXOR XR   75 mg, Oral, DAILY  Refills: 0            STOP taking these medications.      losartan 50 mg Tablet  Commonly known as: COZAAR            ASK your doctor about these medications.        Details   estradioL 0.01 % (0.1 mg/gram) Cream  Commonly known as: ESTRACE   1 g, EVERY MO AND TH  Refills: 0            Discharge med list refreshed?  YES     Allergies   Allergen Reactions    Trimethoprim Diarrhea and Nausea/ Vomiting     Other Reaction(s): Not available, vomiting    Ultram [Tramadol] Itching     HOSPITAL PROCEDURE(S):   No orders of the defined types were placed in this encounter.      REASON FOR HOSPITALIZATION AND HOSPITAL COURSE   BRIEF HPI:  This is a 62 y.o., female admitted for urinary tract infection, acute respiratory failure with hypoxia, exaggerated bilateral hand tremors    BRIEF HOSPITAL NARRATIVE:       This is a 62 year old female with a PMH of depression/anxiety, hypertension, mitral valve prolapse, migraines and OSA who presented to the ER  with complaints of exaggerated tremors and upper extremity twitches.  She also reported some nausea and vomiting.       Patient mentioned that she has had tremors intermittently and is being worked up by a neurologist.  He just completed an MRI brain but the result is still pending.     Urinalysis is positive.  However, urine culture positive for mixed contaminant.  She was on ceftriaxone with improvement in symptoms.  Ceftriaxone was then transitioned to cefdinir.    Bilateral hand tremors improved.    She required O2 supplementation temporarily.  Patient mentioned that she has a history of nocturnal O2 desaturation and history of OSA.    Patient was encouraged to ambulate.  Incentive spirometry was provided.  Outpatient referral to pulmonology was provided for sleep studies    Patient will follow up outpatient with a neurologist for continued workup for her bilateral hand tremors.          TRANSITION/POST DISCHARGE CARE/PENDING TESTS/REFERRALS: None    CONDITION ON DISCHARGE:  A. Ambulation: Full ambulation  B. Self-care Ability: Complete  C. Cognitive Status Alert and Oriented x 3  D. Code status at discharge:       LINES/DRAINS/WOUNDS AT DISCHARGE:   Patient Lines/Drains/Airways Status       Active Line / Dialysis Catheter / Dialysis Graft / Drain / Airway / Wound       None                    DISCHARGE DISPOSITION:  Home discharge  DISCHARGE INSTRUCTIONS:  Post-Discharge Follow Up Appointments       Follow up with Regenia Skeeter, MD    Phone: [phone]    Where: [facility]    Follow up with Mariah Milling, DO in 1 week(s)    Phone: [phone]    Where: [address], [place] [address]      Thursday Oct 11, 2023    Return Patient Visit with Evalyn Casco, APRN-FNP-BC at 10:20 AM      Pulmonary, [facility]  [facility], [place]  [address]  [place] [address]  [phone]          No discharge procedures on  file.       Delight Ovens, MD    Copies sent to Care Team         Relationship Specialty Notifications Start End    Mariah Milling, DO PCP - General FAMILY MEDICINE All results, Admissions 04/01/22     Phone: [phone] Fax: [phone]         [address] [place]  [address]    Regenia Skeeter, MD PCP - Pulmonologist PULMONARY DISEASE Admissions 03/27/23     Phone: [phone] Fax: [phone]         [address] [place] [address]            Referring providers can utilize [email or web address] to access their referred [facility] patient's information.

## 2023-06-29 LAB — ADULT ROUTINE BLOOD CULTURE, SET OF 2 BOTTLES (BACTERIA AND YEAST)
BLOOD CULTURE, ROUTINE: NO GROWTH
BLOOD CULTURE, ROUTINE: NO GROWTH

## 2023-07-04 IMAGING — US US ABDOMEN COMPLETE
1 series · 14 of 25 positions shown · non-contrast
Comparison: Previous exam dated 09/29/2022.

﻿EXAM: US ABDOMEN COMPLETE
INDICATION: History of fatty liver. History of left lobe of the liver cyst.

[Series 1: us abdomen complete · 14 of 50 slices shown]
[im 1/50]
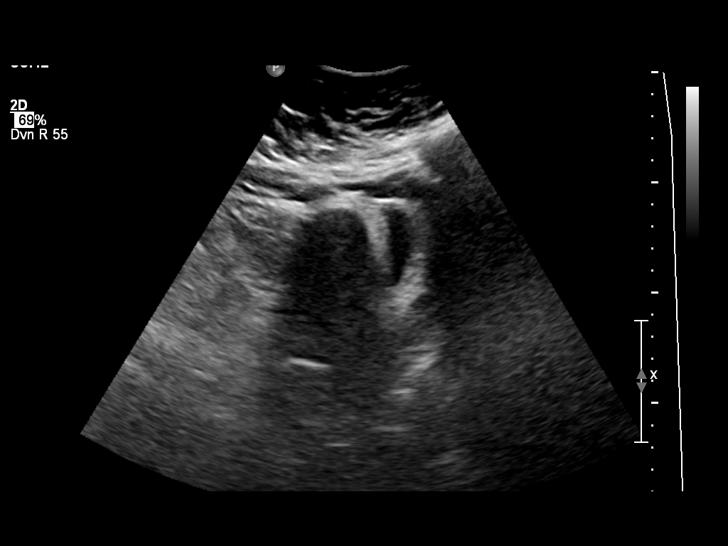
[im 5/50]
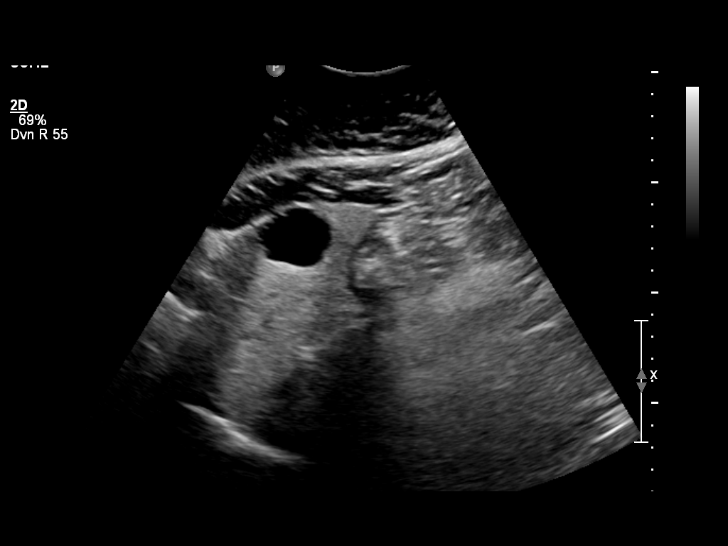
[im 9/50]
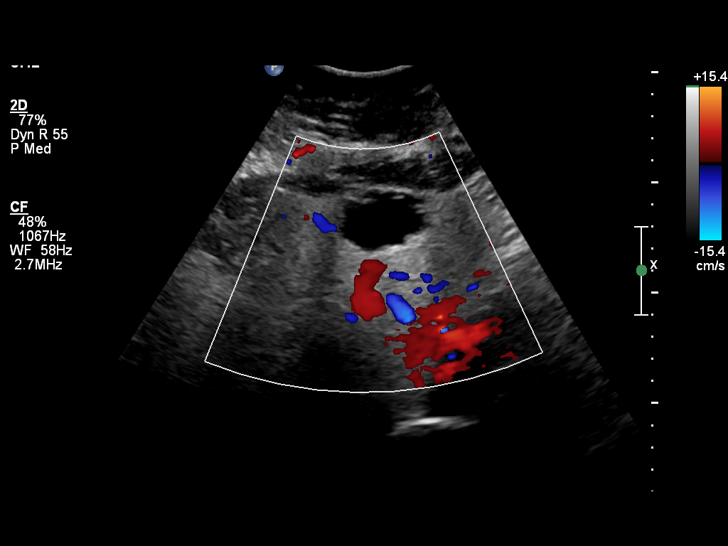
[im 13/50]
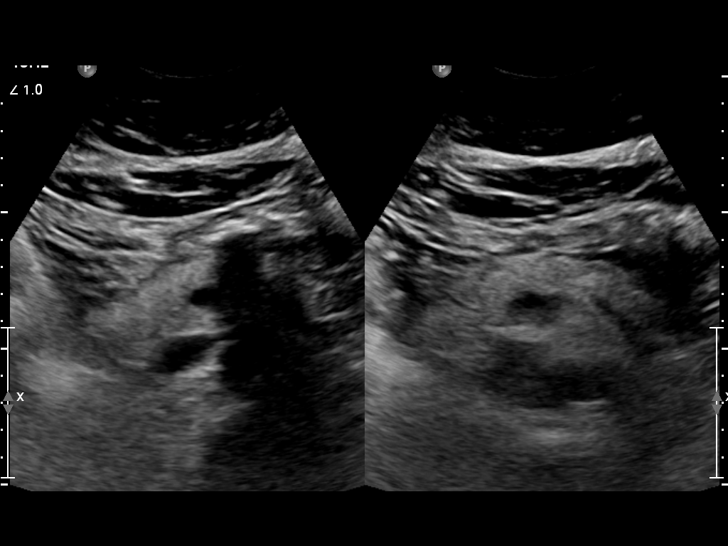
[im 17/50]
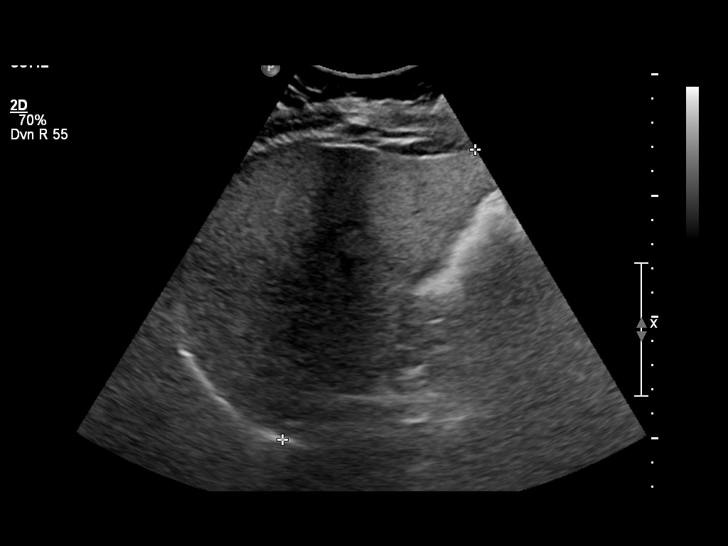
[im 19/50]
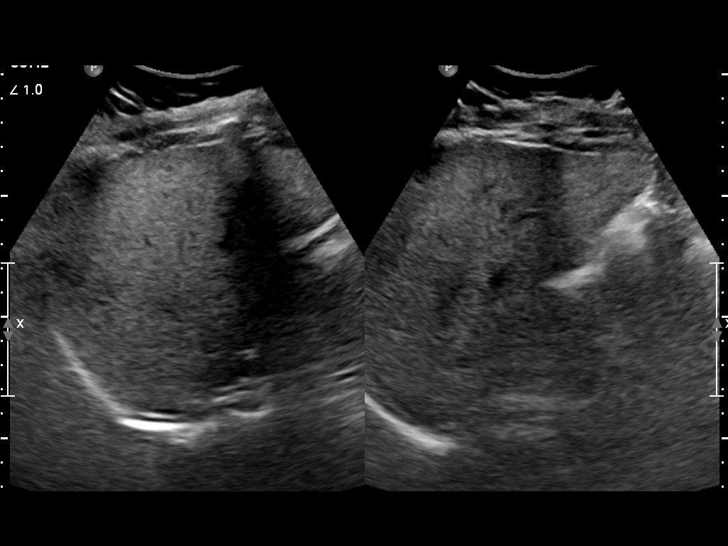
[im 23/50]
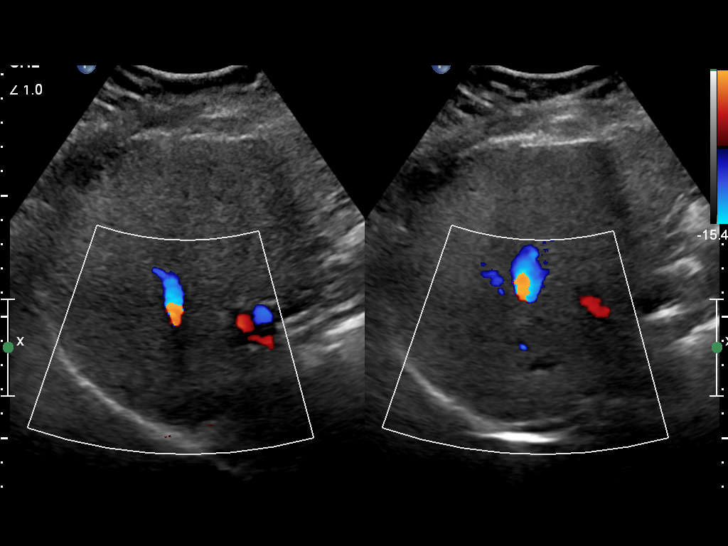
[im 27/50]
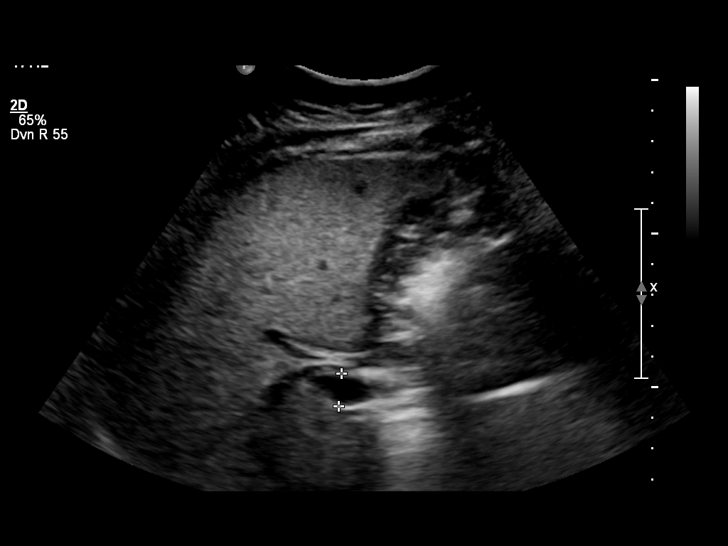
[im 31/50]
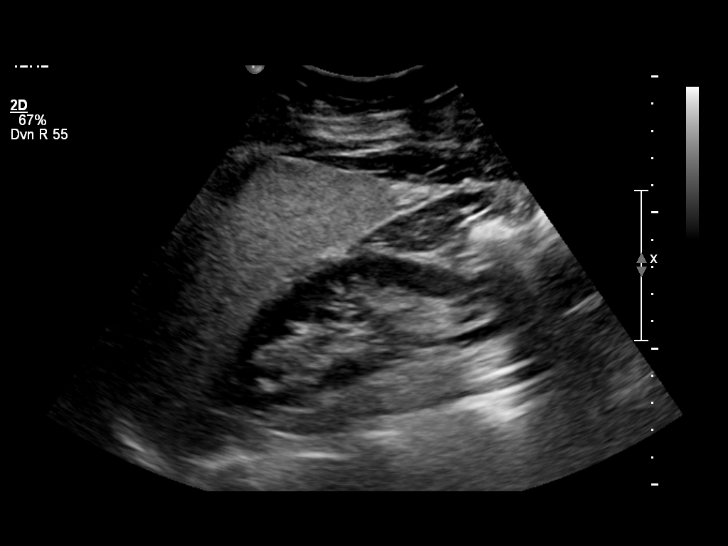
[im 33/50]
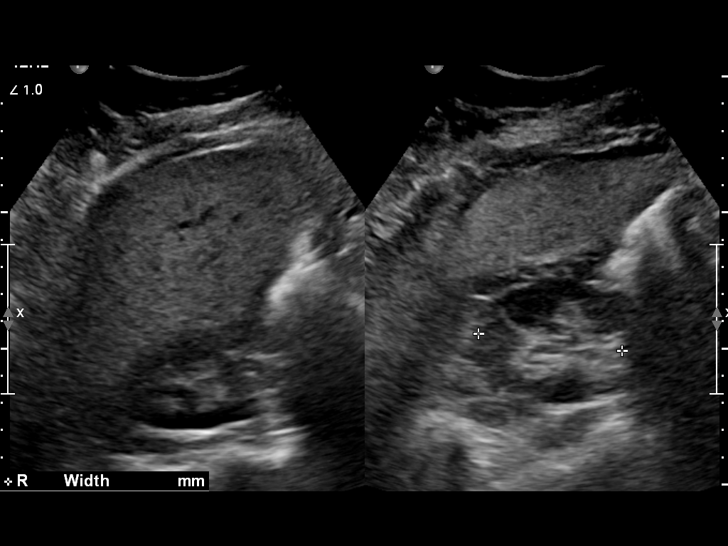
[im 37/50]
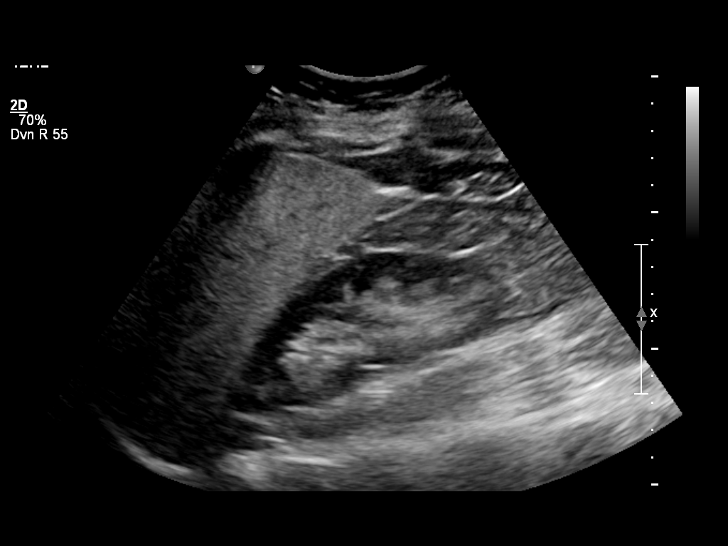
[im 41/50]
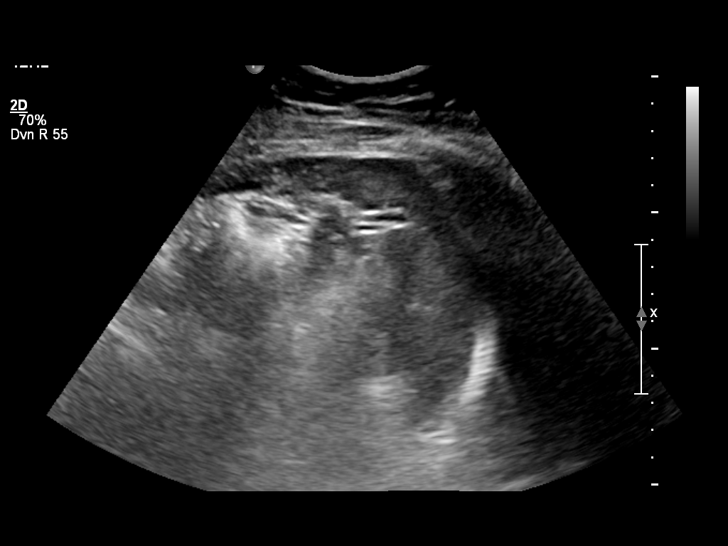
[im 45/50]
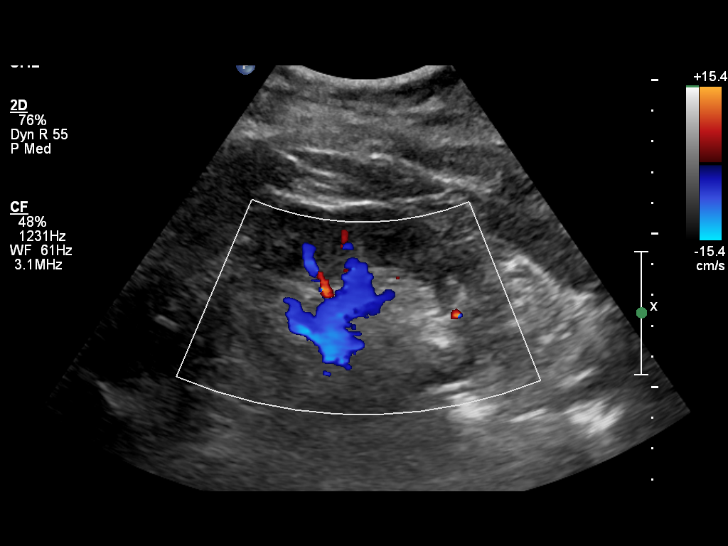
[im 50/50]
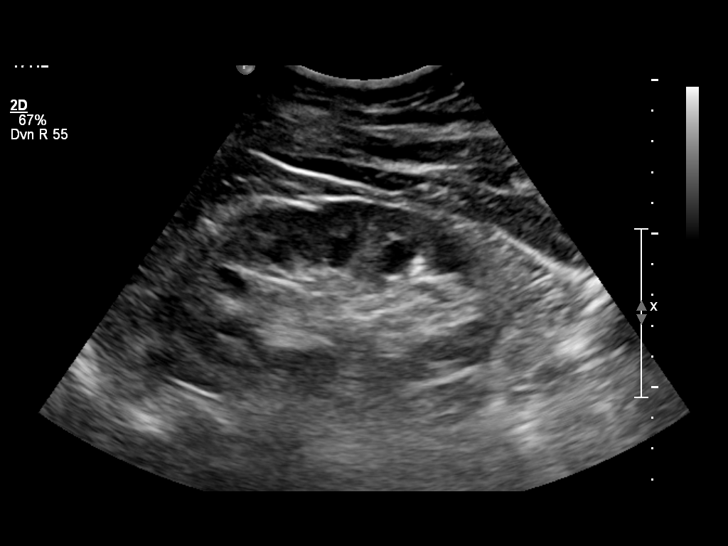

[14 of 25 positions shown; findings below may reference images not displayed]

FINDINGS: The gallbladder is absent. Common hepatic duct measures 5 mm.

Normal size fatty liver 14.5 cm in length. Stable 3 x 4 cm cyst of the left lobe of the liver. Portal vein and hepatic veins are patent.  Normal size spleen 10 cm in length. Aorta, vena cava and kidneys are normal. Pancreas is poorly visualized.  Kidneys show increased echotexture of renal cortex due to medical renal changes.
IMPRESSION: 1. Status post cholecystectomy.  Normal size bile ducts.

2. Normal size fatty liver with stable cystic lesion of the left lobe as mentioned above. Normal size spleen. No free fluid.

3. Pancreas incompletely visualized due to artifact from overlying bowel gas.

## 2023-07-10 NOTE — Care Management Notes (Signed)
Referral Information  ++++++ Placed Provider #1 ++++++  Case Manager: Vicki Johnson  Provider Type: DME  Provider Name: [facility]  Address:  [address]  [place],  [id number]  Contact: [place] [facility]    Phone: [phone] x  Fax:   Fax: [phone]

## 2023-09-25 ENCOUNTER — Other Ambulatory Visit (HOSPITAL_COMMUNITY): Payer: Self-pay | Admitting: Family Medicine

## 2023-09-25 DIAGNOSIS — R251 Tremor, unspecified: Secondary | ICD-10-CM

## 2023-10-11 ENCOUNTER — Ambulatory Visit (INDEPENDENT_AMBULATORY_CARE_PROVIDER_SITE_OTHER): Payer: Self-pay | Admitting: NURSE PRACTITIONER

## 2023-10-18 ENCOUNTER — Ambulatory Visit (HOSPITAL_COMMUNITY): Payer: Self-pay

## 2023-11-11 ENCOUNTER — Other Ambulatory Visit: Payer: Medicare Other

## 2023-12-24 ENCOUNTER — Other Ambulatory Visit: Payer: Self-pay

## 2023-12-24 ENCOUNTER — Ambulatory Visit
Admission: RE | Admit: 2023-12-24 | Discharge: 2023-12-24 | Disposition: A | Discharge status: Home / Self Care | Payer: Medicare Other | Source: Ambulatory Visit | Attending: Family Medicine | Admitting: Family Medicine

## 2023-12-24 DIAGNOSIS — R251 Tremor, unspecified: Secondary | ICD-10-CM | POA: Insufficient documentation

## 2023-12-24 MED ORDER — GADOBUTROL 10 MMOL/10 ML (1 MMOL/ML) INTRAVENOUS SOLUTION
10.0000 mL | INTRAVENOUS | Status: AC
Start: 2023-12-24 — End: 2023-12-24
  Administered 2023-12-24: 9 mL via INTRAVENOUS

## 2024-04-16 ENCOUNTER — Other Ambulatory Visit (HOSPITAL_COMMUNITY): Payer: Self-pay | Admitting: PHYSICIAN ASSISTANT

## 2024-04-16 DIAGNOSIS — Z1239 Encounter for other screening for malignant neoplasm of breast: Secondary | ICD-10-CM

## 2024-05-01 ENCOUNTER — Ambulatory Visit (HOSPITAL_COMMUNITY)

## 2024-05-08 ENCOUNTER — Encounter (HOSPITAL_COMMUNITY): Payer: Self-pay

## 2024-05-08 ENCOUNTER — Other Ambulatory Visit: Payer: Self-pay

## 2024-05-08 ENCOUNTER — Ambulatory Visit
Admission: RE | Admit: 2024-05-08 | Discharge: 2024-05-08 | Disposition: A | Discharge status: Home / Self Care | Source: Ambulatory Visit | Attending: PHYSICIAN ASSISTANT | Admitting: PHYSICIAN ASSISTANT

## 2024-05-08 DIAGNOSIS — Z1239 Encounter for other screening for malignant neoplasm of breast: Secondary | ICD-10-CM

## 2024-05-08 DIAGNOSIS — Z1231 Encounter for screening mammogram for malignant neoplasm of breast: Secondary | ICD-10-CM | POA: Insufficient documentation

## 2024-06-16 ENCOUNTER — Other Ambulatory Visit (HOSPITAL_COMMUNITY): Payer: Self-pay | Admitting: PHYSICIAN ASSISTANT

## 2024-06-16 ENCOUNTER — Other Ambulatory Visit: Payer: Self-pay

## 2024-06-16 ENCOUNTER — Ambulatory Visit
Admission: RE | Admit: 2024-06-16 | Discharge: 2024-06-16 | Disposition: A | Discharge status: Home / Self Care | Source: Ambulatory Visit | Attending: PHYSICIAN ASSISTANT | Admitting: PHYSICIAN ASSISTANT

## 2024-06-16 DIAGNOSIS — R0602 Shortness of breath: Secondary | ICD-10-CM

## 2024-06-16 DIAGNOSIS — R0600 Dyspnea, unspecified: Secondary | ICD-10-CM | POA: Insufficient documentation

## 2024-10-03 ENCOUNTER — Other Ambulatory Visit (HOSPITAL_COMMUNITY): Payer: Self-pay | Admitting: Cardiovascular Disease

## 2024-10-03 DIAGNOSIS — I471 Supraventricular tachycardia, unspecified: Secondary | ICD-10-CM

## 2024-10-03 DIAGNOSIS — R002 Palpitations: Secondary | ICD-10-CM

## 2024-10-03 DIAGNOSIS — R079 Chest pain, unspecified: Secondary | ICD-10-CM

## 2024-10-06 ENCOUNTER — Ambulatory Visit
Admission: RE | Admit: 2024-10-06 | Discharge: 2024-10-06 | Disposition: A | Discharge status: Home / Self Care | Source: Ambulatory Visit | Attending: Cardiovascular Disease | Admitting: Cardiovascular Disease

## 2024-10-06 ENCOUNTER — Other Ambulatory Visit: Payer: Self-pay

## 2024-10-06 DIAGNOSIS — R079 Chest pain, unspecified: Secondary | ICD-10-CM | POA: Insufficient documentation

## 2024-10-06 DIAGNOSIS — I471 Supraventricular tachycardia, unspecified: Secondary | ICD-10-CM | POA: Insufficient documentation

## 2024-10-06 DIAGNOSIS — R002 Palpitations: Secondary | ICD-10-CM | POA: Insufficient documentation

## 2024-10-19 LAB — 7 DAY EXTENDED HOLTER MONITOR
Enrollment Period End: 20251110081037
Enrollment Period Start: 20251103092725
Heart rate (average): 67 {beats}/min
Isolated SVE count: 1045 episodes
Isolated VE Counts: 34 episodes
Longest supraventricular tachycardia episode - duration: 4.3 s
Longest supraventricular tachycardia episode - heart rate (: 106 {beats}/min
Longest supraventricular tachycardia episode - number of be: 8 beats
SVE Couplets Counts: 8 episodes
SVE Triplets Counts: 3 episodes
Supraventricular tachycardia - heart rate (average): 106 {beats}/min
Supraventricular tachycardia - number of episodes: 1
Supraventricular tachycardia with fastest heart rate - dura: 4.3 s
Supraventricular tachycardia with fastest heart rate - hear: 106 {beats}/min
Supraventricular tachycardia with fastest heart rate - numb: 8 beats
Ventricular tachycardia - heart rate (average): -1 {beats}/min

## 2024-11-24 ENCOUNTER — Other Ambulatory Visit (HOSPITAL_COMMUNITY): Payer: Self-pay | Admitting: Family

## 2024-11-24 DIAGNOSIS — K760 Fatty (change of) liver, not elsewhere classified: Secondary | ICD-10-CM

## 2025-04-20 ENCOUNTER — Ambulatory Visit
